# Patient Record
Sex: Female | Born: 1985 | Race: Black or African American | Hispanic: No | Marital: Single | Smoking: Current every day smoker
Health system: Southern US, Community
[De-identification: ages and names within clinical notes are randomized; demographics above are authoritative.]

## PROBLEM LIST (undated history)

## (undated) DIAGNOSIS — E119 Type 2 diabetes mellitus without complications: Secondary | ICD-10-CM

## (undated) DIAGNOSIS — I10 Essential (primary) hypertension: Secondary | ICD-10-CM

## (undated) DIAGNOSIS — E282 Polycystic ovarian syndrome: Secondary | ICD-10-CM

## (undated) DIAGNOSIS — M9908 Segmental and somatic dysfunction of rib cage: Secondary | ICD-10-CM

---
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
## 2005-10-05 ENCOUNTER — Emergency Department: Payer: Self-pay | Admitting: Emergency Medicine

## 2007-02-11 ENCOUNTER — Emergency Department: Payer: Self-pay | Admitting: Emergency Medicine

## 2007-06-17 ENCOUNTER — Emergency Department: Payer: Self-pay | Admitting: Internal Medicine

## 2008-06-26 ENCOUNTER — Emergency Department: Payer: Self-pay | Admitting: Emergency Medicine

## 2010-01-03 ENCOUNTER — Emergency Department: Payer: Self-pay | Admitting: Internal Medicine

## 2011-01-26 ENCOUNTER — Emergency Department: Payer: Self-pay | Admitting: Emergency Medicine

## 2011-02-22 ENCOUNTER — Inpatient Hospital Stay: Payer: Self-pay | Admitting: Internal Medicine

## 2011-05-08 ENCOUNTER — Emergency Department: Payer: Self-pay | Admitting: Emergency Medicine

## 2012-06-15 ENCOUNTER — Emergency Department: Payer: Self-pay | Admitting: Emergency Medicine

## 2012-06-15 LAB — WET PREP, GENITAL

## 2012-06-15 LAB — URINALYSIS, COMPLETE
Leukocyte Esterase: NEGATIVE
Nitrite: NEGATIVE
RBC,UR: 1 /HPF (ref 0–5)
Specific Gravity: 1.017 (ref 1.003–1.030)
Squamous Epithelial: 4
WBC UR: 1 /HPF (ref 0–5)

## 2012-06-15 LAB — PREGNANCY, URINE: Pregnancy Test, Urine: NEGATIVE m[IU]/mL

## 2012-06-17 LAB — URINE CULTURE

## 2013-01-15 ENCOUNTER — Emergency Department: Payer: Self-pay | Admitting: Emergency Medicine

## 2013-05-03 ENCOUNTER — Emergency Department: Payer: Self-pay | Admitting: Emergency Medicine

## 2014-07-12 ENCOUNTER — Emergency Department: Payer: Self-pay | Admitting: Emergency Medicine

## 2014-11-28 ENCOUNTER — Encounter: Payer: Self-pay | Admitting: Urgent Care

## 2014-11-28 ENCOUNTER — Emergency Department
Admission: EM | Admit: 2014-11-28 | Discharge: 2014-11-28 | Disposition: A | Discharge status: Home / Self Care | Payer: Self-pay | Attending: Emergency Medicine | Admitting: Emergency Medicine

## 2014-11-28 ENCOUNTER — Emergency Department: Payer: Self-pay

## 2014-11-28 DIAGNOSIS — Z3202 Encounter for pregnancy test, result negative: Secondary | ICD-10-CM | POA: Insufficient documentation

## 2014-11-28 DIAGNOSIS — E282 Polycystic ovarian syndrome: Secondary | ICD-10-CM | POA: Insufficient documentation

## 2014-11-28 DIAGNOSIS — N939 Abnormal uterine and vaginal bleeding, unspecified: Secondary | ICD-10-CM

## 2014-11-28 DIAGNOSIS — N938 Other specified abnormal uterine and vaginal bleeding: Secondary | ICD-10-CM | POA: Insufficient documentation

## 2014-11-28 DIAGNOSIS — Z720 Tobacco use: Secondary | ICD-10-CM | POA: Insufficient documentation

## 2014-11-28 HISTORY — DX: Polycystic ovarian syndrome: E28.2

## 2014-11-28 LAB — CBC
HCT: 33 % — ABNORMAL LOW (ref 35.0–47.0)
Hemoglobin: 10.1 g/dL — ABNORMAL LOW (ref 12.0–16.0)
MCH: 22.8 pg — AB (ref 26.0–34.0)
MCHC: 30.4 g/dL — ABNORMAL LOW (ref 32.0–36.0)
MCV: 75 fL — AB (ref 80.0–100.0)
Platelets: 309 10*3/uL (ref 150–440)
RBC: 4.4 MIL/uL (ref 3.80–5.20)
RDW: 16.3 % — ABNORMAL HIGH (ref 11.5–14.5)
WBC: 11.2 10*3/uL — ABNORMAL HIGH (ref 3.6–11.0)

## 2014-11-28 LAB — COMPREHENSIVE METABOLIC PANEL
ALBUMIN: 4.1 g/dL (ref 3.5–5.0)
ALT: 15 U/L (ref 14–54)
AST: 18 U/L (ref 15–41)
Alkaline Phosphatase: 87 U/L (ref 38–126)
Anion gap: 7 (ref 5–15)
BILIRUBIN TOTAL: 0.3 mg/dL (ref 0.3–1.2)
BUN: 13 mg/dL (ref 6–20)
CO2: 26 mmol/L (ref 22–32)
CREATININE: 0.87 mg/dL (ref 0.44–1.00)
Calcium: 9.1 mg/dL (ref 8.9–10.3)
Chloride: 106 mmol/L (ref 101–111)
Glucose, Bld: 118 mg/dL — ABNORMAL HIGH (ref 65–99)
Potassium: 3.9 mmol/L (ref 3.5–5.1)
SODIUM: 139 mmol/L (ref 135–145)
Total Protein: 7.7 g/dL (ref 6.5–8.1)

## 2014-11-28 LAB — TYPE AND SCREEN
ABO/RH(D): O POS
ANTIBODY SCREEN: NEGATIVE

## 2014-11-28 LAB — WET PREP, GENITAL
Clue Cells Wet Prep HPF POC: NONE SEEN
Trich, Wet Prep: NONE SEEN
Yeast Wet Prep HPF POC: NONE SEEN

## 2014-11-28 LAB — ABO/RH: ABO/RH(D): O POS

## 2014-11-28 LAB — POCT PREGNANCY, URINE: PREG TEST UR: NEGATIVE

## 2014-11-28 LAB — CHLAMYDIA/NGC RT PCR (ARMC ONLY)
CHLAMYDIA TR: NOT DETECTED
N GONORRHOEAE: NOT DETECTED

## 2014-11-28 LAB — HCG, QUANTITATIVE, PREGNANCY: hCG, Beta Chain, Quant, S: <1 m[IU]/mL (ref <5)

## 2014-11-28 MED ORDER — MEDROXYPROGESTERONE ACETATE 5 MG PO TABS: 5.0000 mg | ORAL_TABLET | Freq: Every day | ORAL | Status: AC

## 2014-11-28 MED ORDER — SODIUM CHLORIDE 0.9 % IV BOLUS (SEPSIS)
1000.0000 mL | Freq: Once | INTRAVENOUS | Status: AC
  Administered 2014-11-28: 1000 mL via INTRAVENOUS

## 2014-11-28 NOTE — ED Notes (Addendum)
Patient presents with reports of dizziness that "just started". Patient reporting that she has been menstruating x 1.5 months. States, "I am scared that it will be like it was in 2012. I passed out and had to get a blood transfusion." Reports using "3 bags" of pads today.

## 2014-11-28 NOTE — ED Notes (Signed)
MD at bedside to review ultrasound results with patient.

## 2014-11-28 NOTE — ED Notes (Signed)
Patient back from Ultrasound.

## 2014-11-28 NOTE — ED Notes (Signed)
Patient transported to Ultrasound 

## 2014-11-28 NOTE — Discharge Instructions (Signed)
Abnormal Uterine Bleeding Abnormal uterine bleeding can affect women at various stages in life, including teenagers, women in their reproductive years, pregnant women, and women who have reached menopause. Several kinds of uterine bleeding are considered abnormal, including:  Bleeding or spotting between periods.   Bleeding after sexual intercourse.   Bleeding that is heavier or more than normal.   Periods that last longer than usual.  Bleeding after menopause.  Many cases of abnormal uterine bleeding are minor and simple to treat, while others are more serious. Any type of abnormal bleeding should be evaluated by your health care provider. Treatment will depend on the cause of the bleeding. HOME CARE INSTRUCTIONS Monitor your condition for any changes. The following actions may help to alleviate any discomfort you are experiencing:  Avoid the use of tampons and douches as directed by your health care provider.  Change your pads frequently. You should get regular pelvic exams and Pap tests. Keep all follow-up appointments for diagnostic tests as directed by your health care provider.  SEEK MEDICAL CARE IF:   Your bleeding lasts more than 1 week.   You feel dizzy at times.  SEEK IMMEDIATE MEDICAL CARE IF:   You pass out.   You are changing pads every 15 to 30 minutes.   You have abdominal pain.  You have a fever.   You become sweaty or weak.   You are passing large blood clots from the vagina.   You start to feel nauseous and vomit. MAKE SURE YOU:   Understand these instructions.  Will watch your condition.  Will get help right away if you are not doing well or get worse. Document Released: 05/05/2005 Document Revised: 05/10/2013 Document Reviewed: 12/02/2012 [employer] Patient Information 2015 [employer]. This information is not intended to replace advice given to you by your health care provider. Make sure you discuss any questions you have with your  health care provider.  

## 2014-11-28 NOTE — ED Provider Notes (Signed)
[facility] Emergency Department Provider Note  ____________________________________________  Time seen: Approximately 407 AM  I have reviewed the triage vital signs and the nursing notes.   HISTORY  Chief Complaint Dizziness    HPI Suzanne Fernandez is a 29 y.o. female who comes in with dizziness. The patient reports that she has been having vaginal bleeding for approximately 1-1/2 months. The patient reports that she has irregular periods will be gone for 3 months and will come back for 1-2 weeks. The patient reports that she has not seen a doctor since this period started. She has had some lower abdominal pain that is dull and sharp. She reports that the dizziness started today and she feels as though she is lightheaded whenever she lays down and gets up. The patient reports that she's had a transfusion in the past in 2012 due to irregular vaginal bleeding.The patient reports that her pain was a 0 out of 10 currently. The patient reports that she has been wearing 2 pads and going through them every hour.   Past Medical History  Diagnosis Date  . PCOS (polycystic ovarian syndrome)     There are no active problems to display for this patient.   History reviewed. No pertinent past surgical history.  No current outpatient prescriptions on file.  Allergies Review of patient's allergies indicates no known allergies.  No family history on file.  Social History History  Substance Use Topics  . Smoking status: Current Every Day Smoker  . Smokeless tobacco: Not on file  . Alcohol Use: No    Review of Systems Constitutional: No fever/chills Eyes: Allergic vision. ENT: No sore throat. Cardiovascular: Denies chest pain. Respiratory: Denies shortness of breath. Gastrointestinal: Lower abdominal pain Genitourinary: Vaginal bleeding Musculoskeletal: Negative for back pain. Skin: Negative for rash. Neurological: Dizziness  10-point ROS otherwise  negative.  ____________________________________________   PHYSICAL EXAM:  VITAL SIGNS: ED Triage Vitals  Enc Vitals Group     BP 11/28/14 0348 161/121 mmHg     Pulse Rate 11/28/14 0348 74     Resp 11/28/14 0348 18     Temp 11/28/14 0348 98.6 F (37 C)     Temp Source 11/28/14 0348 Oral     SpO2 11/28/14 0348 100 %     Weight 11/28/14 0348 207 lb (93.895 kg)     Height 11/28/14 0348  (1.626 m)     Head Cir --      Peak Flow --      Pain Score 11/28/14 0349 0     Pain Loc --      Pain Edu? --      Excl. in GC? --     Constitutional: Alert and oriented. Well appearing and in mild distress. Eyes: Conjunctivae are normal. PERRL. EOMI. Head: Atraumatic. Nose: No congestion/rhinnorhea. Mouth/Throat: Mucous membranes are moist.  Oropharynx non-erythematous. Cardiovascular: Normal rate, regular rhythm. Grossly normal heart sounds.  Good peripheral circulation. Respiratory: Normal respiratory effort.  No retractions. Lungs CTAB. Gastrointestinal: Soft and nontender. No distention. Positive bowel sounds Genitourinary: Normal external genitalia moderate blood in the vaginal vault with a clot removed. No cervical motion tenderness to palpation no masses palpated in the uterus or adnexa no pain to palpation. Musculoskeletal: No lower extremity tenderness nor edema.   Neurologic:  Normal speech and language.   Skin:  Skin is warm, dry and intact. No rash noted. Psychiatric: Mood and affect are normal.   ____________________________________________   LABS (all labs ordered are  listed, but only abnormal results are displayed)  Labs Reviewed  WET PREP, GENITAL - Abnormal; Notable for the following:    WBC, Wet Prep HPF POC FEW (*)    All other components within normal limits  CBC - Abnormal; Notable for the following:    WBC 11.2 (*)    Hemoglobin 10.1 (*)    HCT 33.0 (*)    MCV 75.0 (*)    MCH 22.8 (*)    MCHC 30.4 (*)    RDW 16.3 (*)    All other components within  normal limits  COMPREHENSIVE METABOLIC PANEL - Abnormal; Notable for the following:    Glucose, Bld 118 (*)    All other components within normal limits  CHLAMYDIA/NGC RT PCR ([facility] ONLY)  HCG, QUANTITATIVE, PREGNANCY  POCT PREGNANCY, URINE  TYPE AND SCREEN   ____________________________________________  EKG  None ____________________________________________  RADIOLOGY  Pelvic ultrasound: Acute findings, numerous small bilateral ovarian follicles without evidence of dominant follicle or corpus luteum. Findings meet sonographic criteria for polycystic ovarian syndrome ____________________________________________   PROCEDURES  Procedure(s) performed: None  Critical Care performed: No  ____________________________________________   INITIAL IMPRESSION / ASSESSMENT AND PLAN / ED COURSE  Pertinent labs & imaging results that were available during my care of the patient were reviewed by me and considered in my medical decision making (see chart for details).  This is a [age] year old female whose been having vaginal bleeding for 1-1/2 months who comes in today with dizziness. The patient reports that she is nervous she would need another transfusion. We'll check some blood work as well as a pelvic ultrasound. Currently the patient is not bleeding immensely. We will reassess the patient when she's receive some fluid as well as her ultrasound.  ----------------------------------------- 7:38 AM on 11/28/2014 -----------------------------------------  The patient received a liter of normal saline. Her ultrasound is unremarkable aside from her follicles due to her polycystic ovarian syndrome. I will discharge the patient home with a prescription for Provera and have her follow up with OB/GYN for further evaluation of her dysfunctional uterine bleeding. ____________________________________________   FINAL CLINICAL IMPRESSION(S) / ED DIAGNOSES  Final diagnoses:  Vaginal  bleeding      Rebecka ApleyAllison P Purvis Sidle, MD 11/28/14 [phone]

## 2015-03-27 ENCOUNTER — Emergency Department
Admission: EM | Admit: 2015-03-27 | Discharge: 2015-03-27 | Disposition: A | Discharge status: Home / Self Care | Payer: Self-pay | Attending: Emergency Medicine | Admitting: Emergency Medicine

## 2015-03-27 ENCOUNTER — Emergency Department: Payer: Self-pay

## 2015-03-27 ENCOUNTER — Encounter: Payer: Self-pay | Admitting: Emergency Medicine

## 2015-03-27 DIAGNOSIS — M2141 Flat foot [pes planus] (acquired), right foot: Secondary | ICD-10-CM | POA: Insufficient documentation

## 2015-03-27 DIAGNOSIS — Z720 Tobacco use: Secondary | ICD-10-CM | POA: Insufficient documentation

## 2015-03-27 DIAGNOSIS — R030 Elevated blood-pressure reading, without diagnosis of hypertension: Secondary | ICD-10-CM | POA: Insufficient documentation

## 2015-03-27 DIAGNOSIS — M2142 Flat foot [pes planus] (acquired), left foot: Secondary | ICD-10-CM | POA: Insufficient documentation

## 2015-03-27 DIAGNOSIS — Z79899 Other long term (current) drug therapy: Secondary | ICD-10-CM | POA: Insufficient documentation

## 2015-03-27 MED ORDER — NAPROXEN 500 MG PO TABS: 500.0000 mg | ORAL_TABLET | Freq: Two times a day (BID) | ORAL | Status: DC

## 2015-03-27 MED ORDER — TRAMADOL HCL 50 MG PO TABS: 50.0000 mg | ORAL_TABLET | Freq: Four times a day (QID) | ORAL | Status: DC | PRN

## 2015-03-27 MED ORDER — IBUPROFEN 800 MG PO TABS
800.0000 mg | ORAL_TABLET | Freq: Once | ORAL | Status: AC
  Administered 2015-03-27: 800 mg via ORAL
  Filled 2015-03-27: qty 1

## 2015-03-27 MED ORDER — TRAMADOL HCL 50 MG PO TABS
50.0000 mg | ORAL_TABLET | Freq: Once | ORAL | Status: AC
  Administered 2015-03-27: 50 mg via ORAL
  Filled 2015-03-27: qty 1

## 2015-03-27 NOTE — ED Provider Notes (Signed)
[facility] Emergency Department Provider Note  ____________________________________________  Time seen: Approximately 11:01 AM  I have reviewed the triage vital signs and the nursing notes.   HISTORY  Chief Complaint Foot Pain    HPI Willa RoughKristian S Chandley is a 29 y.o. female patient complaining of bilateral foot pain right greater than left frontal more than a month. Patient denies any injury. Patient states she recently started working a month ago requiring prolonged standing. Patient stated after prolonged standing she noticed swelling and pain to her feet. Patient is rating her pain as a 10 over 10. Patient denies any palliative measures for this complaint.  Past Medical History  Diagnosis Date  . PCOS (polycystic ovarian syndrome)     There are no active problems to display for this patient.   History reviewed. No pertinent past surgical history.  Current Outpatient Rx  Name  Route  Sig  Dispense  Refill  . medroxyPROGESTERone (PROVERA) 5 MG tablet   Oral   Take 1 tablet (5 mg total) by mouth daily.   10 tablet   0     Allergies Review of patient's allergies indicates no known allergies.  History reviewed. No pertinent family history.  Social History Social History  Substance Use Topics  . Smoking status: Current Every Day Smoker  . Smokeless tobacco: None  . Alcohol Use: No    Review of Systems Constitutional: No fever/chills Eyes: No visual changes. ENT: No sore throat. Cardiovascular: Denies chest pain. Respiratory: Denies shortness of breath. Gastrointestinal: No abdominal pain.  No nausea, no vomiting.  No diarrhea.  No constipation. Genitourinary: Negative for dysuria. Musculoskeletal: Lateral foot pain  Skin: Negative for rash. Neurological: Negative for headaches, focal weakness or numbness. 10-point ROS otherwise negative.  ____________________________________________   PHYSICAL EXAM:  VITAL SIGNS: ED Triage Vitals   Enc Vitals Group     BP 03/27/15 1047 154/93 mmHg     Pulse Rate 03/27/15 1047 80     Resp 03/27/15 1047 18     Temp 03/27/15 1047 98.2 F (36.8 C)     Temp Source 03/27/15 1047 Oral     SpO2 03/27/15 1047 100 %     Weight 03/27/15 1047 200 lb (90.719 kg)     Height 03/27/15 1047 5\' 4"  (1.626 m)     Head Cir --      Peak Flow --      Pain Score 03/27/15 1042 10     Pain Loc --      Pain Edu? --      Excl. in GC? --     Constitutional: Alert and oriented. Moderate distress Eyes: Conjunctivae are normal. PERRL. EOMI. Head: Atraumatic. Nose: No congestion/rhinnorhea. Mouth/Throat: Mucous membranes are moist.  Oropharynx non-erythematous. Neck: No stridor.  No cervical spine tenderness to palpation. Hematological/Lymphatic/Immunilogical: No cervical lymphadenopathy. Cardiovascular: Normal rate, regular rhythm. Grossly normal heart sounds.  Good peripheral circulation. Mildly elevated BP Respiratory: Normal respiratory effort.  No retractions. Lungs CTAB. Gastrointestinal: Soft and nontender. No distention. No abdominal bruits. No CVA tenderness. Musculoskeletal: Bilateral Pes Planus. .Neurologic:  Normal speech and language. No gross focal neurologic deficits are appreciated. No gait instability. Skin:  Skin is warm, dry and intact. No rash noted. Psychiatric: Mood and affect are normal. Speech and behavior are normal.  ____________________________________________   LABS (all labs ordered are listed, but only abnormal results are displayed)  Labs Reviewed - No data to display ____________________________________________  EKG   ____________________________________________  RADIOLOGY no acute  findings. I, Joni Reining, personally viewed and evaluated these images (plain radiographs) as part of my medical decision making.   ____________________________________________   PROCEDURES  Procedure(s) performed: None  Critical Care performed:  No  ____________________________________________   INITIAL IMPRESSION / ASSESSMENT AND PLAN / ED COURSE  Pertinent labs & imaging results that were available during my care of the patient were reviewed by me and considered in my medical decision making (see chart for details).  Bilateral foot pain secondary to pes planus. Discussed x-ray findings with patient. Patient given prescription for naproxen and tramadol. Patient given a work excuse for 2 days. Patient advised to follow-up with the podiatry clinic for definitive. ____________________________________________   FINAL CLINICAL IMPRESSION(S) / ED DIAGNOSES  Final diagnoses:  Pes planus of both feet      TAREVA LESKE, PA-C 03/27/15 1246  Sharman Cheek, MD 03/27/15 1521

## 2015-03-27 NOTE — ED Notes (Signed)
States she has had pain to bottoms for both feet since sept..denies any injury pain is increased after standing Also, she has additional complaints of swelling noted to feet with standing

## 2015-03-27 NOTE — ED Notes (Signed)
Pt to ed with c/o bilat feet pain since September.  Pt states she stands all night at work and this has increased her pain.

## 2015-03-27 NOTE — Discharge Instructions (Signed)
Flat Feet Having flat feet is a common condition. One foot or both might be affected. People of any age can have flat feet. In fact, everyone is born with them. But most of the time, the foot gradually develops an arch. That is the curve on the bottom of the foot that creates a gap between the foot and the ground. An arch usually develops in childhood. Sometimes, though, an arch never develops and the foot stays flat on the bottom. Other times, an arch develops but later collapses (caves in). That is what gives the condition its nickname, "fallen arches." The medical term for flat feet is pes planus. Some people have flat feet their whole life and have no problems. For others, the condition causes pain and needs to be corrected.  CAUSES   A problem with the foot's soft tissue; tendons and ligaments could be loose.  This can cause what is called flexible flat feet. That means the shape of the foot changes with pressure. When standing on the toes, a curved arch can be seen. When standing on the ground, the foot is flat.  Wear and tear. Sometimes arches simply flatten over time.  Damage to the posterior tibial tendon. This is the tendon that goes from the inside of the ankle to the bones in the middle of the foot. It is the main support for the arch. If the tendon is injured, stretched or torn, the arch might flatten.  Tarsal coalition. With this condition, two or more bones in the foot are joined together (fused ) during development in the womb. This limits movement and can lead to a flat foot. SYMPTOMS   The foot is even with the ground from toe to heel. Your caregiver will look closely at the inside of the foot while you are standing.  Pain along the bottom of the foot. Some people describe the pain as tightness.  Swelling on the inside of the foot or ankle.  Changes in the way you walk (gait).  The feet lean inward, starting at the ankle (pronation). DIAGNOSIS  To decide if a child or  adult has flat feet, a healthcare provider will probably:  Do a physical examination. This might include having the person stand on his or her toes and then stand normally. The caregiver will also hold the foot and put pressure on the foot in different directions.  Check the person's shoes. The pattern of wear on the soles can offer clues.  Order images (pictures) of the foot. They can help identify the cause of any pain. They also will show injuries to bones or tendons that could be causing the condition. The images can come from:  X-rays.  Computed tomography (CT) scan. This combines X-ray and a computer.  Magnetic resonance imaging (MRI). This uses magnets, radio waves and a computer to take a picture of the foot. It is the best technique to evaluate tendons, ligaments and muscles. TREATMENT   Flexible flat feet usually are painless. Most of the time, gait is not affected. Most children grow out of the condition. Often no treatment is needed. If there is pain, treatment options include:  Orthotics. These are inserts that go in the shoes. They add support and shape to the feet. An orthotic is custom-made from a mold of the foot.  Shoes. Not all shoes are the same. People with flat feet need arch support. However, too much can be painful. It is important to find shoes that offer the right amount   of support. Athletes, especially runners, may need to try shoes made just for people with flatter feet.  Medication. For pain, only take over-the-counter medicine for pain, discomfort, as directed by your caregiver.  Rest. If the feet start to hurt, cut back on the exercise which increases the pain. Use common sense.  For damage to the posterior tibial tendon, options include:  Orthotics. Also adding a wedge on the inside edge may help. This can relieve pressure on the tendon.  Ankle brace, boot or cast. These supports can ease the load on the tendon while it heals.  Surgery. If the tendon is  torn, it might need to be repaired.  For tarsal coalition, similar options apply:  Pain medication.  Orthotics.  A cast and crutches. This keeps weight off the foot.  Physical therapy.  Surgery to remove the bone bridge joining the two bones together. PROGNOSIS  In most people, flat feet do not cause pain or problems. People can go about their normal activities. However, if flat feet are painful, they can and should be treated. Treatment usually relieves the pain. HOME CARE INSTRUCTIONS   Take any medications prescribed by the healthcare provider. Follow the directions carefully.  Wear, or make sure a child wears, orthotics or special shoes if this was suggested. Be sure to ask how often and for how long they should be worn.  Do any exercises or therapy treatments that were suggested.  Take notes on when the pain occurs. This will help healthcare providers decide how to treat the condition.  If surgery is needed, be sure to find out if there is anything that should or should not be done before the operation. SEEK MEDICAL CARE IF:   Pain worsens in the foot or lower leg.  Pain disappears after treatment, but then returns.  Walking or simple exercise becomes difficult or causes foot pain.  Orthotics or special shoes are uncomfortable or painful.   This information is not intended to replace advice given to you by your health care provider. Make sure you discuss any questions you have with your health care provider.   Document Released: 03/02/2009 Document Revised: 07/28/2011 Document Reviewed: 11/01/2014 [employer] Interactive Patient Education 2016 [employer]  

## 2015-12-22 ENCOUNTER — Encounter: Payer: Self-pay | Admitting: Emergency Medicine

## 2015-12-22 ENCOUNTER — Emergency Department
Admission: EM | Admit: 2015-12-22 | Discharge: 2015-12-22 | Disposition: A | Discharge status: Home / Self Care | Payer: Self-pay | Attending: Emergency Medicine | Admitting: Emergency Medicine

## 2015-12-22 ENCOUNTER — Emergency Department: Payer: Self-pay

## 2015-12-22 DIAGNOSIS — Z791 Long term (current) use of non-steroidal anti-inflammatories (NSAID): Secondary | ICD-10-CM | POA: Insufficient documentation

## 2015-12-22 DIAGNOSIS — M79601 Pain in right arm: Secondary | ICD-10-CM | POA: Insufficient documentation

## 2015-12-22 DIAGNOSIS — M79603 Pain in arm, unspecified: Secondary | ICD-10-CM

## 2015-12-22 DIAGNOSIS — Z79899 Other long term (current) drug therapy: Secondary | ICD-10-CM | POA: Insufficient documentation

## 2015-12-22 DIAGNOSIS — F17200 Nicotine dependence, unspecified, uncomplicated: Secondary | ICD-10-CM | POA: Insufficient documentation

## 2015-12-22 LAB — CBC
HEMATOCRIT: 31 % — AB (ref 35.0–47.0)
Hemoglobin: 9.7 g/dL — ABNORMAL LOW (ref 12.0–16.0)
MCH: 19.3 pg — AB (ref 26.0–34.0)
MCHC: 31.4 g/dL — AB (ref 32.0–36.0)
MCV: 61.5 fL — ABNORMAL LOW (ref 80.0–100.0)
Platelets: 354 10*3/uL (ref 150–440)
RBC: 5.04 MIL/uL (ref 3.80–5.20)
RDW: 21.1 % — AB (ref 11.5–14.5)
WBC: 8.4 10*3/uL (ref 3.6–11.0)

## 2015-12-22 LAB — BASIC METABOLIC PANEL
Anion gap: 7 (ref 5–15)
BUN: 15 mg/dL (ref 6–20)
CHLORIDE: 104 mmol/L (ref 101–111)
CO2: 27 mmol/L (ref 22–32)
CREATININE: 0.88 mg/dL (ref 0.44–1.00)
Calcium: 9.4 mg/dL (ref 8.9–10.3)
GFR calc Af Amer: >60 mL/min (ref >60)
GFR calc non Af Amer: >60 mL/min (ref >60)
GLUCOSE: 105 mg/dL — AB (ref 65–99)
Potassium: 3.7 mmol/L (ref 3.5–5.1)
Sodium: 138 mmol/L (ref 135–145)

## 2015-12-22 LAB — CK: Total CK: 116 U/L (ref 38–234)

## 2015-12-22 LAB — MAGNESIUM: Magnesium: 2.2 mg/dL (ref 1.7–2.4)

## 2015-12-22 MED ORDER — KETOROLAC TROMETHAMINE 60 MG/2ML IM SOLN
60.0000 mg | Freq: Once | INTRAMUSCULAR | Status: AC
  Administered 2015-12-22: 60 mg via INTRAMUSCULAR
  Filled 2015-12-22: qty 2

## 2015-12-22 MED ORDER — MELOXICAM 15 MG PO TABS: 15.0000 mg | ORAL_TABLET | Freq: Every day | ORAL | 0 refills | Status: DC

## 2015-12-22 NOTE — ED Notes (Signed)
Discussed pain management with pt, pt states has no driver. Pt states took tylenol pta. Pt wishes to wait for md evaluation for pain management.

## 2015-12-22 NOTE — ED Notes (Signed)
Pt alert and oriented X4, active, cooperative, pt in NAD. RR even and unlabored, color WNL.  Pt informed to return if any life threatening symptoms occur.   

## 2015-12-22 NOTE — ED Triage Notes (Signed)
Pt complains or right arm pain from shoulder to fingers. Pt states she has tingling to right fingers, denies nausea, vomiting, chest pain, dizziness, shob, diaphoresis.

## 2015-12-22 NOTE — ED Provider Notes (Signed)
[facility] Emergency Department Provider Note   ____________________________________________   First MD Initiated Contact with Patient 12/22/15 [phone]     (approximate)  I have reviewed the triage vital signs and the nursing notes.   HISTORY  Chief Complaint Arm Pain    HPI Suzanne Fernandez is a 30 y.o. female who comes into the hospital today with pain in her right arm. The patient reports that the pain started the beginning of the week and starts from her shoulder and goes down her hand. The patient reports she's never had this pain before. She isn't taking Tylenol for the pain. She reports that the pain is achy. The patient's pain is 8 out of 10 in intensity. She does not have a doctor. She denies any injury to her arm. She has been out of work so she's not been using it extraocular doing anything with it. The patient denies any headache, blurred vision, chest pain, shortness of breath, nausea, vomiting, abdominal pain. She reports that she feels as though the pain is in her bones. The patient decided to come into the hospital today for further evaluation.The patient has no weakness, numbness, tingling to her arm.   Past Medical History:  Diagnosis Date  . PCOS (polycystic ovarian syndrome)     There are no active problems to display for this patient.   History reviewed. No pertinent surgical history.  Prior to Admission medications   Medication Sig Start Date End Date Taking? Authorizing Provider  medroxyPROGESTERone (PROVERA) 5 MG tablet Take 1 tablet (5 mg total) by mouth daily. 11/28/14 11/28/15  Rebecka Apley, MD  meloxicam (MOBIC) 15 MG tablet Take 1 tablet (15 mg total) by mouth daily. 12/22/15 12/21/16  Rebecka Apley, MD  naproxen (NAPROSYN) 500 MG tablet Take 1 tablet (500 mg total) by mouth 2 (two) times daily with a meal. 03/27/15   Joni Reining, PA-C  traMADol (ULTRAM) 50 MG tablet Take 1 tablet (50 mg total) by mouth every 6 (six)  hours as needed for moderate pain. 03/27/15   Joni Reining, PA-C    Allergies Review of patient's allergies indicates no known allergies.  No family history on file.  Social History Social History  Substance Use Topics  . Smoking status: Current Every Day Smoker  . Smokeless tobacco: Never Used  . Alcohol use No    Review of Systems Constitutional: No fever/chills Eyes: No visual changes. ENT: No sore throat. Cardiovascular: Denies chest pain. Respiratory: Denies shortness of breath. Gastrointestinal: No abdominal pain.  No nausea, no vomiting.  No diarrhea.  No constipation. Genitourinary: Negative for dysuria. Musculoskeletal: Right arm pain Skin: Negative for rash. Neurological: Negative for headaches, focal weakness or numbness.  10-point ROS otherwise negative.  ____________________________________________   PHYSICAL EXAM:  VITAL SIGNS: ED Triage Vitals  Enc Vitals Group     BP 12/22/15 0010 (!) 177/94     Pulse Rate 12/22/15 0010 92     Resp 12/22/15 0010 16     Temp 12/22/15 0010 98.8 F (37.1 C)     Temp Source 12/22/15 0010 Oral     SpO2 12/22/15 0010 100 %     Weight 12/22/15 0010 210 lb (95.3 kg)     Height 12/22/15 0010 5\' 4"  (1.626 m)     Head Circumference --      Peak Flow --      Pain Score 12/22/15 0011 7     Pain Loc --  Pain Edu? --      Excl. in GC? --     Constitutional: Alert and oriented. Well appearing and in mild distress. Eyes: Conjunctivae are normal. PERRL. EOMI. Head: Atraumatic. Nose: No congestion/rhinnorhea. Mouth/Throat: Mucous membranes are moist.  Oropharynx non-erythematous. Cardiovascular: Normal rate, regular rhythm. Grossly normal heart sounds.  Good peripheral circulation. Respiratory: Normal respiratory effort.  No retractions. Lungs CTAB. Gastrointestinal: Soft and nontender. No distention. Positive bowel sounds Musculoskeletal: Right arm tenderness to palpation Neurologic:  Normal speech and language. No  weakness or numbness to the patient's right arm Skin:  Skin is warm, dry and intact.  Psychiatric: Mood and affect are normal.   ____________________________________________   LABS (all labs ordered are listed, but only abnormal results are displayed)  Labs Reviewed  CBC - Abnormal; Notable for the following:       Result Value   Hemoglobin 9.7 (*)    HCT 31.0 (*)    MCV 61.5 (*)    MCH 19.3 (*)    MCHC 31.4 (*)    RDW 21.1 (*)    All other components within normal limits  BASIC METABOLIC PANEL - Abnormal; Notable for the following:    Glucose, Bld 105 (*)    All other components within normal limits  MAGNESIUM  CK   ____________________________________________  EKG  none ____________________________________________  RADIOLOGY  Doppler ultrasound right upper extremity: No evidence of right upper extremity deep venous thrombosis ____________________________________________   PROCEDURES  Procedure(s) performed: None  Procedures  Critical Care performed: No  ____________________________________________   INITIAL IMPRESSION / ASSESSMENT AND PLAN / ED COURSE  Pertinent labs & imaging results that were available during my care of the patient were reviewed by me and considered in my medical decision making (see chart for details).  This is a 30 year old female who comes in with some right upper extremity pain. The patient's ultrasound does not show a DVT. I'll give the patient a shot of Toradol as well as check some blood work to include a CK and a magnesium. The patient will be reassessed after she is receive her blood work.  Clinical Course    The patient's blood work is unremarkable. The patient's pain is improved after the shot of Toradol. The patient will be discharged home to follow-up with the acute care clinic. I will give the patient prescription for meloxicam which will help with her pain as well. She should have her blood pressure further evaluated at  the walk-in clinic as well. ____________________________________________   FINAL CLINICAL IMPRESSION(S) / ED DIAGNOSES  Final diagnoses:  Arm pain  Pain of right upper extremity      NEW MEDICATIONS STARTED DURING THIS VISIT:  New Prescriptions   MELOXICAM (MOBIC) 15 MG TABLET    Take 1 tablet (15 mg total) by mouth daily.     Note:  This document was prepared using Dragon voice recognition software and may include unintentional dictation errors.    Rebecka Apley, MD 12/22/15 [phone]

## 2016-02-04 ENCOUNTER — Emergency Department
Admission: EM | Admit: 2016-02-04 | Discharge: 2016-02-04 | Disposition: A | Discharge status: Home / Self Care | Payer: Self-pay | Attending: Emergency Medicine | Admitting: Emergency Medicine

## 2016-02-04 ENCOUNTER — Emergency Department: Payer: Self-pay

## 2016-02-04 ENCOUNTER — Encounter: Payer: Self-pay | Admitting: Medical Oncology

## 2016-02-04 DIAGNOSIS — M25572 Pain in left ankle and joints of left foot: Secondary | ICD-10-CM

## 2016-02-04 DIAGNOSIS — F17200 Nicotine dependence, unspecified, uncomplicated: Secondary | ICD-10-CM | POA: Insufficient documentation

## 2016-02-04 DIAGNOSIS — M79672 Pain in left foot: Secondary | ICD-10-CM

## 2016-02-04 DIAGNOSIS — M7989 Other specified soft tissue disorders: Secondary | ICD-10-CM | POA: Insufficient documentation

## 2016-02-04 DIAGNOSIS — R52 Pain, unspecified: Secondary | ICD-10-CM

## 2016-02-04 MED ORDER — GABAPENTIN 100 MG PO CAPS: 100.0000 mg | ORAL_CAPSULE | Freq: Three times a day (TID) | ORAL | 0 refills | Status: AC

## 2016-02-04 NOTE — Discharge Instructions (Signed)
Take chlorpheniramine Delsym as needed for cough and cold symptoms.

## 2016-02-04 NOTE — ED Notes (Signed)
See triage   States she has had bilateral feet pain  States pain is worse on the left   Pain radiates into lower legs  Denies any injury hx of same of several months also has some swelling d/t prolonged standing

## 2016-02-04 NOTE — ED Provider Notes (Signed)
[facility] Emergency Department Provider Note  ____________________________________________  Time seen: Approximately 11:18 AM  I have reviewed the triage vital signs and the nursing notes.   HISTORY  Chief Complaint Ankle Pain and Foot Pain    HPI Suzanne Fernandez is a 30 y.o. female presents for evaluation of recurrent foot pain bilaterally left now greater than right. Patient reports that she's been seen here several times and follow up with podiatry with no relief or diagnosis. Continues to complain of of continued foot pain.   Past Medical History:  Diagnosis Date  . PCOS (polycystic ovarian syndrome)     There are no active problems to display for this patient.   History reviewed. No pertinent surgical history.  Prior to Admission medications   Medication Sig Start Date End Date Taking? Authorizing Provider  gabapentin (NEURONTIN) 100 MG capsule Take 1 capsule (100 mg total) by mouth 3 (three) times daily. 02/04/16 02/03/17  Charmayne Sheerharles M Saralyn Willison, PA-C  medroxyPROGESTERone (PROVERA) 5 MG tablet Take 1 tablet (5 mg total) by mouth daily. 11/28/14 11/28/15  Rebecka ApleyAllison P Webster, MD    Allergies Review of patient's allergies indicates no known allergies.  No family history on file.  Social History Social History  Substance Use Topics  . Smoking status: Current Every Day Smoker  . Smokeless tobacco: Never Used  . Alcohol use No    Review of Systems Constitutional: No fever/chills Eyes: No visual changes. ENT: No sore throat. Cardiovascular: Denies chest pain. Respiratory: Denies shortness of breath. Gastrointestinal: No abdominal pain.  No nausea, no vomiting.  No diarrhea.  No constipation. Genitourinary: Negative for dysuria. Musculoskeletal: Positive for bilateral foot pain and left greater than right Skin: Negative for rash. Neurological: Negative for headaches, focal weakness or numbness.  10-point ROS otherwise  negative.  ____________________________________________   PHYSICAL EXAM:  VITAL SIGNS: ED Triage Vitals  Enc Vitals Group     BP 02/04/16 1112 (!) 152/105     Pulse Rate 02/04/16 1112 80     Resp 02/04/16 1112 20     Temp 02/04/16 1112 98.5 F (36.9 C)     Temp Source 02/04/16 1112 Oral     SpO2 02/04/16 1112 99 %     Weight 02/04/16 1055 210 lb (95.3 kg)     Height 02/04/16 1055 5\' 4"  (1.626 m)     Head Circumference --      Peak Flow --      Pain Score 02/04/16 1055 10     Pain Loc --      Pain Edu? --      Excl. in GC? --     Constitutional: Alert and oriented. Well appearing and in no acute distress. Cardiovascular: Normal rate, regular rhythm. Grossly normal heart sounds.  Good peripheral circulation. Respiratory: Normal respiratory effort.  No retractions. Lungs CTAB. Musculoskeletal: No lower extremity tenderness nor edema.  No joint effusions.Tenderness and swelling to the medial aspect of the left foot. Patient appears to be flat-footed. Neurologic:  Normal speech and language. No gross focal neurologic deficits are appreciated. Gait not tested secondary to pain. Skin:  Skin is warm, dry and intact. No rash noted. Psychiatric: Mood and affect are normal. Speech and behavior are normal.  ____________________________________________   LABS (all labs ordered are listed, but only abnormal results are displayed)  Labs Reviewed - No data to display ____________________________________________  EKG   ____________________________________________  RADIOLOGY  No acute osseous findings noted. ____________________________________________   PROCEDURES  Procedure(s)  performed: None  Critical Care performed: No  ____________________________________________   INITIAL IMPRESSION / ASSESSMENT AND PLAN / ED COURSE  Pertinent labs & imaging results that were available during my care of the patient were reviewed by me and considered in my medical decision making  (see chart for details). Review of the [place] CSRS was performed in accordance of the [facility] prior to dispensing any controlled drugs.  Reassurance provided to the patient. Encouraged her follow-up with Podiatry. as needed.  Clinical Course    ____________________________________________   FINAL CLINICAL IMPRESSION(S) / ED DIAGNOSES  Final diagnoses:  Ankle pain, left  Foot pain, left     This chart was dictated using voice recognition software/Dragon. Despite best efforts to proofread, errors can occur which can change the meaning. Any change was purely unintentional.    Evangeline Dakin, PA-C 02/04/16 1340    Minna Antis, MD 02/04/16 1515

## 2016-02-04 NOTE — ED Triage Notes (Signed)
Bilt foot and ankle pain for years. No known injury.

## 2016-02-09 LAB — RAPID STREP TEST: Rapid Strep screen: NEGATIVE

## 2016-02-09 NOTE — Other (Addendum)
[facility]    [place], [place]                         REPORT TYPE:   Urgent Care Provider Report             REPORT STATUS: Signed    ________________________________________________________________________        PATIENT NAME:      Dorothy Santos,Dorothy Santos   PATIENT ID #:   [id number]    BIRTHDATE:           05-01-1986   ACCOUNT #:     [id number]    AGE:           30      REG DATE:       02/09/16      GENDER:           F          LOC:          [facility]   Urgent Care Arrival: 02/09/16  1138h    ________________________________________________________________________        Lysle DingwallINTERESTED PARTIES:       Location:  [facility]               ATTENDING PHYSICIAN: Vicie MuttersSHULA,Lucille Witts APRN, FNP          ADDITIONAL COPIES:  DECHENES,CYNTHIA MD;DECHENES,CYNTHIA MD                                     ________________________________________________________________________    DICTAVicie Mutters:  Maddalyn Lutze APRN, FNP        History of Present Illness    Date of Service:  Feb 09, 2016    Chief Complaint    Sore throat and dizziness    Travelled outside the [place]:  No    Patient displaying symptoms:  No    This is a 30 year old female who presents to [facility] for a sore throat and   dizziness. She says the symptoms started yesterday. She says it is painful to swallow but she   denies throat swelling. She has had a minor nonproductive cough and a little bit of nasal   discharge. She denies ear pain, sinus pain or pressure, eye discharge, difficulty breathing,   shortness of breath, fever, chills, muscle aches, syncope or near-syncope, abdominal pain, nausea,   or vomiting. She says she has a history of repeated strep throat infections and says she had her   tonsils removed earlier this year due to recurring infections. She's also been feeling a lot of   pressure in her head and headache with her symptoms she is causing some dizziness/lightheadedness   but denies sinus pain or pressure. She is currently on her period and  denies any chance of   pregnancy.        Review of Systems    Constitutional:  denies: chills, fever    Ears, Nose, Throat, Mouth:  sore throat, painful swallowing,  denies: ear pain, ear discharge,   throat swelling    Respiratory:  cough,  denies: shortness of breath, sputum    Cardiovascular:  denies: chest pain    Gastrointestinal:  denies: abdominal pain, constipation, decreased fluid intake, diarrhea, nausea,   vomiting    Neurologic:  dizziness    ALLERGIES:      Coded Allergies:  No Known Allergies (Unverified , 02/09/16)    ------------------------------         HOME MEDICATIONS     Active     Reported    Prenatal 1 Plus 1 Tablet (Prenatal Vit/Fe Fumarate/Fa) 1 Tab Tablet 1 Tab PO             Past Medical History    Medical History:  no pertinent history    Surgical History:  cholecystectomy, tonsillectomy    Social History    Smoking Status:  Never Smoker        Physical Exam    VITAL SIGNS         Vital Signs              Date Time  Temp Pulse Resp B/P Pulse Ox O2 Delivery O2 Flow Rate FiO2         02/09/16 11:54 36.7 83 16 121/77 100 Room Air              Nursing Documentation Vitals:  reviewed by me    *    GENERAL: well-developed, well-nourished, in no distress    HEAD: atraumatic, normocephalic, non-tender frontal and maxillary sinuses on percussion    EYES: PERRLA, no eye discharge or injection    EARS: bilateral tympanic membranes pearly gray, non-bulging, normal light reflex    NOSE: nasal turbinates non-swollen, moist without redness or discharge    OROPHARYNX: Oropharynx is mildly erythematous, no exudates. Buccal mucosa moist,  no posterior   pharyngeal cobblestoning; tonsils are surgically absent; uvula is midline and airway is patent    NECK: Anterior cervical chain with 1 swollen lymph node bilaterally, neck supple, full ROM,  no   stridor    LUNGS: clear to auscultation bilaterally, no wheezes, rales or rhonchi; no percussion tenderness or    dullness; no accessory retraction or nasal flaring    HEART: regular rate and rhythm; no carotid bruits; no JVD; no leg edema    ABDOMEN: Soft, nontender, no hepatosplenomegaly    SKIN: Warm and dry    VASCULAR: Capillary refill less than 2 seconds    PSYCH: Alert and oriented x3        Labs/Rads/ECGs    Laboratory Results         Laboratory Tests            Test 02/09/16         12:09         Group A Streptococcus Rapid Negative                 Course    Medical Decision Making    Patient's vital signs are stable and she's afebrile. Rapid strep was negative. Have sent this for   culture to confirm. If positive, will need to initiate penicillin. Vital signs are stable and   patient afebrile. No evidence of  abscess, epiglottitis, or airway distress. Oropharynx is mildly   erythematous. Patient advised to use Tylenol or ibuprofen and salt water gargles for discomfort.   Advised her to return for new or worsening symptoms. Patient verbalized understanding and   agreement. Suspect her dizziness may be related to her headache. Vitals are stable. Patient denies   room spinning sensations.        Discharge Plan    Clinical Impression:       Primary Impression:       Pharyngitis        Additional Instructions:    Rapid strep  was negative today. We will send this for culture and call you if the culture is   positive, as we would then need to start antibiotics.         Try ibuprofen as directed for pain and fever as needed.         Do salt water gargles to help with the pain.        Continue hydration including cold popsicles, cold clear fluids and soft foods.         Return if symptoms worsen.    Referrals:      DECHENES,CYNTHIA MD (PCP)            Vicie Mutters APRN, FNP Feb 09, 2016 12:13        Electronically Signed By:   Bertram Gala, FNP at 02/09/16 1307        Disclaimer: Portions of this document may contain text elements generated through computerized    voice recognition. Undetected computer generated word errors are possible. Please notify the   signing provider or return the document to [facility] Records department if clarification or   correction is needed.

## 2016-02-12 LAB — CULTURE, STREP THROAT

## 2016-06-09 LAB — AMB EXT LIPID PANEL
HDL, External: 73
LDL-C, External: 97
Total Cholesterol, External: 187
Triglycerides, External: 84

## 2016-06-09 LAB — AMB EXT TSH: TSH, EXTERNAL: 1.92

## 2016-06-09 LAB — AMB EXT HCT: Hct, External: 42.7

## 2016-06-09 LAB — AMB EXT HGBA1C: Hemoglobin A1c, External: 4.7

## 2016-09-01 ENCOUNTER — Encounter

## 2016-10-01 ENCOUNTER — Ambulatory Visit: Attending: Family Medicine | Primary: Family

## 2016-10-09 ENCOUNTER — Ambulatory Visit
Admit: 2016-10-09 | Discharge: 2016-10-09 | Payer: PRIVATE HEALTH INSURANCE | Attending: Family Medicine | Primary: Family

## 2016-10-09 DIAGNOSIS — Z6834 Body mass index (BMI) 34.0-34.9, adult: Secondary | ICD-10-CM

## 2016-10-09 NOTE — Progress Notes (Signed)
Progress Note     Chief Complaint   Patient presents with   ??? Obesity     WK 28, AF,        History of Present Illness  Dorothy Santos is a 31 y.o. old female patient [facility] for a follow up appointment for obesity and associated medical problems.  So far, treatment has included medical nutrition therapy and intensive behavioral therapy.    She is [redacted] weeks pregnant at this time.  She has been struggling lately with food aversions.  She finds that she is more hungry in the morning lately.    Has found she does not desire  Eggs in the morning and one yogurt does fill her, She been eating 2 yogurts or a shake in the morning of late.    Insulin resistance: Tried to limit carbohydrates. She reports that she had a normal glucose tolerance test.      Meal Plan:limiting carbohydrates      Review of Systems        See HPI   The review of systems is negative for General, CV, and Resp.           Weight Management Results  Initial weight: 224 lb 3.2 oz (101.7 kg)  Previous weight: 220 lb 14.4 oz (100.2 kg)  Today's weight: 224 lb 11.2 oz (101.9 kg)  Weight change since previous visit in pounds: 3.8  Weight change since initial visit in pounds: 0.5  Initial BMI (in kg/m^2): 34.72  Today's BMI (in kg/m^2): 34.65  BMI Change: -0.07  Initial hips in inches: 50  Initial waist in inches: 43      VITALS  Visit Vitals   ??? BP 112/60 (BP 1 Location: Left arm, BP Patient Position: At rest)   ??? Pulse 73   ??? Resp 18   ??? Ht 5' 7.52" (1.715 m)   ??? Wt 224 lb 11.2 oz (101.9 kg)   ??? BMI 34.65 kg/m2   .vit      PAST MEDICAL, SURGICAL, SOCIAL, AND FAMILY HISTORY   Past Medical History:   Diagnosis Date   ??? Depression     Depression/anxiety: improved   ??? Fracture of right radius and ulna     fx r ulna, radius age 89   ??? Irregular menses     irreg menses - 36 day average cycle, on clomid summer 2017   ??? Neck pain    ??? Overweight    ??? 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 HX CHOLECYSTECTOMY  2013   ??? HX OTHER SURGICAL      arm surgery s/p forearm fx as a small child   ??? HX TONSILLECTOMY  07/27/2015    tonsillectomy 07/27/15 Dr.Charles Curlene LabrumGasser     Social History   Substance Use Topics   ??? Smoking status: Never Smoker   ??? Smokeless tobacco: Not on file   ??? Alcohol use Not on file        ALLERGIES AND ADVERSE DRUG REACTIONS   Review of patient's allergies indicates no known allergies.     CURRENT MEDICATIONS     Current Outpatient Prescriptions:   ???  OTHER, TRUBIOTICS ORAL CAPSULE (PROBIOTIC PRODUCT) Take one pill by mouth daily, Disp: , Rfl:   ???  PNV,CALCIUM 72/IRON,CARB/FOLIC (PRENATAL PLUS PO), PRENATAL PLUS 27-1 MG ORAL  TABLET (PRENATAL VIT-FE FUMARATE-FA) 1 daily, Disp: , Rfl:     STOP-BANG Screening   No flowsheet data found.       PHYSICAL EXAM     General: Awake, alert, well oriented, pleasant.  Heart: Regular rate and rhythm without murmur.  Lungs:  Clear to auscultation bilaterally.  Extremities: without edema.  Psychiatric:  Normal speech, affect and judgment.      ASSESSMENT & PLAN   Diagnoses and all orders for this visit:    1. BMI 34.0-34.9,adult  Unchanged  Patient has not gained any weight so far in this pregnancy.  Goal to to gain no more that 3 more pounds by 24 weeks and 7 additional pounds in her third trimester.  I cautioned her about giving into cravings.  Discussed increasing vegetable and nutritious food in her diet.      2. Insulin resistance  Fasting insulin level was 12.  She dis not tolerate metformen secondary to diarrhea.  Will continue to focus on limiting carbohydrates.  F/U 4 weeks.   3. [redacted] weeks gestation of pregnancy   Patient has been advised that her overall weight gain should not be more that 11 pounds for her entire pregnacy.  She is ontrack for this goal.  F/U in 4 weeks,     Orders  @DIAGNOSISORDERS @

## 2016-10-20 ENCOUNTER — Ambulatory Visit: Attending: Registered" | Primary: Family

## 2016-10-20 ENCOUNTER — Ambulatory Visit: Admit: 2016-10-20 | Discharge: 2016-10-20 | Payer: PRIVATE HEALTH INSURANCE | Attending: Registered" | Primary: Family

## 2016-10-20 DIAGNOSIS — E668 Other obesity: Secondary | ICD-10-CM

## 2016-10-20 NOTE — Progress Notes (Signed)
10/20/16    Chief Complaint   Patient presents with   ??? Weight Management     Week #28, Wt Mgt, Nutrition Visit #4         Dorothy Santos  12-06-1985    History of Present Illness  31 y.o. female returns for nutrition follow-up assessment (visit #4) for obesity (BMI is 34.99) on 10/20/16  with the [facility]. Arrives with her husband. Pt is [redacted] weeks gestation. Continues to follow an all-food plan with occasional use of meal replacement shakes, when she is in danger of skipping a meal. Fills quickly, and therefore tries to eat 5-6 small meals per day, rather than 3 larger meals. Has some cravings for junk food, but is able to avoid giving in to these cravings. Does not keep trigger foods in the house. Struggles with some boredom or stress eating and her main strategy for dealing with this is to avoid keeping trigger foods in the house. Experiencing some hunger in the morning. The yogurt she has for breakfast hasn't been keeping her satisfied throughout the morning. Also, pt sees a counselor on a weekly basis. Has maintained her wt throughout the majority of her pregnancy. Overall goal is to gain no more than 11 lbs during pregnancy. Asking about nutrition tips for breastfeeding.    24-Hour Diet Recall:  Breakfast: yogurt or a shake  Lunch: yogurt or a shake  Supper: protein with vegetables and 1/2-1 cup of starch  Snacks - Chooses snacks from protein snack list including eggs, cheese, nuts, peanut butter, and vegetables with hummus      Food Allergies: NKFA  Food Intolerances: None reported  Supplements: Prenatal MVI, Benefiber  Ex[facility]/Activity Level[facility]      Estimated Energy Requirements: 2300 calories per day [facility]([employer] [facility] predictive equation with activity factor of 1.2 + 300 kcals for third trimester pregnancy).  Estimated Protein Requirements:  80g pro/day (0.8g/kg)  Estimated Fluid Requirement:  100oz/day (30 mL/kg)         Nutrition Diagnosis: Overweight/obesity related to history of excess calorie intake as evidenced by pre-pregnancy BMI of over 30.      ASSESSMENT & PLAN   Diagnoses and all orders for this visit:    1. Other obesity  Body mass index is 34.99 kg/(m^2).Marland Kitchen.  Wt change since initial visit: -1 lbs.  Wt change since last visit: -1 lbs.  Meal Plan: All Food  --Continue current diet (all food with 30g CHO/meal and 15g CHO/snack).  --Reviewed appropriate food choices and portion sizes.   --Reviewed the importance of adequate hydration. Discussed strategies for increasing water intake.   --Reviewed the benefits of regular physical activity. [facility]  --Discussed the multiple benefits of keeping a detailed and accurate food journal.   --Discussed additional options for breakfast. Suggest pt try adding a small amount (1/4 cup) oatmeal and/or fruit to her breakfast yogurt to satisfy her longer in the morning.  --Briefly discussed the benefits of breastfeeding and calorie needs during breastfeeding. Discussed the importance of adequate hydration in breastfeeding.     Personal Goals:   1.) Join water aerobics classes.          Visit duration is 30 minutes.   Gurfateh Mcclain J Ranata Laughery MS, RD, CSOWM, LD

## 2016-11-18 ENCOUNTER — Ambulatory Visit
Admit: 2016-11-18 | Discharge: 2016-11-18 | Payer: PRIVATE HEALTH INSURANCE | Attending: Family Medicine | Primary: Family

## 2016-11-18 DIAGNOSIS — Z6834 Body mass index (BMI) 34.0-34.9, adult: Secondary | ICD-10-CM

## 2016-11-18 NOTE — Progress Notes (Signed)
Progress Note     Chief Complaint   Patient presents with   ??? Obesity     Week 32, AF, Provider Visit        History of Present Illness  Dorothy Santos is a 31 y.o. old female patient presenting to [facility] for a follow up appointment for obesity and associated medical problems.  So far, treatment has included medical nutrition therapy and intensive behavioral therapy.    Has found limiting carbs really helps her to no longer crave them.    Insulin resistance: Tried to limit carbohydrates. She reports that she had a normal glucose tolerance test      Review of Systems        See HPI   The review of systems is negative for General, CV, and Resp.           Weight Management Results  Initial weight: 224 lb 3.2 oz (101.7 kg)  Previous weight: 220 lb 14.4 oz (100.2 kg)  Today's weight: 224 lb 4.8 oz (101.7 kg)  Weight change since previous visit in pounds: 3.4  Weight change since initial visit in pounds: 0.1  Initial BMI (in kg/m^2): 34.72  Today's BMI (in kg/m^2): 34.99  BMI Change: 0.27  Initial hips in inches: 50  Initial waist in inches: 43      VITALS  There were no vitals taken for this visit.Marland Kitchenvit      PAST MEDICAL, SURGICAL, SOCIAL, AND FAMILY HISTORY   Past Medical History:   Diagnosis Date   ??? Depression     Depression/anxiety: improved   ??? Fracture of right radius and ulna     fx r ulna, radius age 89   ??? Irregular menses     irreg menses - 36 day average cycle, on clomid summer 2017   ??? Neck pain    ??? Overweight    ??? 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 HX CHOLECYSTECTOMY  2013   ??? HX OTHER SURGICAL      arm surgery s/p forearm fx as a small child   ??? HX TONSILLECTOMY  07/27/2015    tonsillectomy 07/27/15 Dr.Charles Curlene Labrum     Social History   Substance Use Topics   ??? Smoking status: Never Smoker   ??? Smokeless tobacco: Not on file   ??? Alcohol use Not on file         ALLERGIES AND ADVERSE DRUG REACTIONS   Review of patient's allergies indicates no known allergies.     CURRENT MEDICATIONS     Current Outpatient Prescriptions:   ???  metroNIDAZOLE (METROGEL) 1 % topical gel, apply to FACE twice a day, Disp: , Rfl: 0  ???  OTHER, TRUBIOTICS ORAL CAPSULE (PROBIOTIC PRODUCT) Take one pill by mouth daily, Disp: , Rfl:   ???  PNV,CALCIUM 72/IRON,CARB/FOLIC (PRENATAL PLUS PO), PRENATAL PLUS 27-1 MG ORAL TABLET (PRENATAL VIT-FE FUMARATE-FA) 1 daily, Disp: , Rfl:     STOP-BANG Screening   No flowsheet data found.       PHYSICAL EXAM     General: Awake, alert, well oriented, pleasant.  Heart: Regular rate and rhythm without murmur.  Lungs:  Clear to auscultation bilaterally.  Extremities: without edema.  Psychiatric:  Normal speech, affect and judgment.      ASSESSMENT & PLAN   Diagnoses and all orders for this visit:  1. BMI 34.0-34.9,adult  Unchanged  Patient has not gained any weight so far in this pregnancy.  Goal to to gain no more that 3 more pounds by 24 weeks and 7 additional pounds in her third trimester.  Discussed increasing vegetable and nutritious food in her diet.  2. [redacted] weeks gestation of pregnancy   Patient has been advised that her overall weight gain should not be more that 11 pounds for her entire pregnacy.  She is on track for this goal.  F[place] baby [place]  3. Insulin resistance   Fasting insulin level was 12.  She did not tolerate metformen secondary to diarrhea.  Will continue to focus on limiting carbohydrates.  F/U after baby is born in September.     Orders  @DIAGNOSISORDERS @

## 2016-11-20 ENCOUNTER — Encounter: Attending: Family Medicine | Primary: Family

## 2017-03-13 ENCOUNTER — Emergency Department
Admission: EM | Admit: 2017-03-13 | Discharge: 2017-03-14 | Disposition: A | Discharge status: Home / Self Care | Payer: Self-pay | Attending: Emergency Medicine | Admitting: Emergency Medicine

## 2017-03-13 DIAGNOSIS — F17200 Nicotine dependence, unspecified, uncomplicated: Secondary | ICD-10-CM | POA: Insufficient documentation

## 2017-03-13 DIAGNOSIS — E119 Type 2 diabetes mellitus without complications: Secondary | ICD-10-CM | POA: Insufficient documentation

## 2017-03-13 DIAGNOSIS — I10 Essential (primary) hypertension: Secondary | ICD-10-CM | POA: Insufficient documentation

## 2017-03-13 LAB — CBC
HCT: 29.9 % — ABNORMAL LOW (ref 35.0–47.0)
Hemoglobin: 9 g/dL — ABNORMAL LOW (ref 12.0–16.0)
MCH: 18.8 pg — ABNORMAL LOW (ref 26.0–34.0)
MCHC: 30 g/dL — AB (ref 32.0–36.0)
MCV: 62.6 fL — ABNORMAL LOW (ref 80.0–100.0)
PLATELETS: 348 10*3/uL (ref 150–440)
RBC: 4.78 MIL/uL (ref 3.80–5.20)
RDW: 17.3 % — AB (ref 11.5–14.5)
WBC: 8.4 10*3/uL (ref 3.6–11.0)

## 2017-03-13 LAB — COMPREHENSIVE METABOLIC PANEL
ALBUMIN: 4.2 g/dL (ref 3.5–5.0)
ALK PHOS: 110 U/L (ref 38–126)
ALT: 15 U/L (ref 14–54)
AST: 18 U/L (ref 15–41)
Anion gap: 10 (ref 5–15)
BILIRUBIN TOTAL: 0.4 mg/dL (ref 0.3–1.2)
BUN: 11 mg/dL (ref 6–20)
CALCIUM: 9.5 mg/dL (ref 8.9–10.3)
CO2: 24 mmol/L (ref 22–32)
CREATININE: 0.81 mg/dL (ref 0.44–1.00)
Chloride: 102 mmol/L (ref 101–111)
GFR calc Af Amer: >60 mL/min (ref >60)
GLUCOSE: 426 mg/dL — AB (ref 65–99)
Potassium: 3.6 mmol/L (ref 3.5–5.1)
Sodium: 136 mmol/L (ref 135–145)
TOTAL PROTEIN: 8.6 g/dL — AB (ref 6.5–8.1)

## 2017-03-13 LAB — URINALYSIS, COMPLETE (UACMP) WITH MICROSCOPIC
BACTERIA UA: NONE SEEN
BILIRUBIN URINE: NEGATIVE
HGB URINE DIPSTICK: NEGATIVE
Ketones, ur: NEGATIVE mg/dL
Leukocytes, UA: NEGATIVE
NITRITE: NEGATIVE
PH: 5 (ref 5.0–8.0)
Protein, ur: NEGATIVE mg/dL
SPECIFIC GRAVITY, URINE: 1.037 — AB (ref 1.005–1.030)

## 2017-03-13 LAB — LIPASE, BLOOD: Lipase: 31 U/L (ref 11–51)

## 2017-03-13 LAB — POCT PREGNANCY, URINE: Preg Test, Ur: NEGATIVE

## 2017-03-13 NOTE — ED Triage Notes (Signed)
Patient c/o urinary frequency, lower abdominal pain X 2 weeks.

## 2017-03-13 NOTE — ED Notes (Signed)
Patient reports hx of anemia and blood transfusion due to heavy menstrual bleeding.

## 2017-03-14 MED ORDER — METFORMIN HCL 500 MG PO TABS
500.0000 mg | ORAL_TABLET | Freq: Once | ORAL | Status: AC
  Administered 2017-03-14: 500 mg via ORAL
  Filled 2017-03-14: qty 1

## 2017-03-14 MED ORDER — AMLODIPINE BESYLATE 5 MG PO TABS
2.5000 mg | ORAL_TABLET | Freq: Once | ORAL | Status: AC
  Administered 2017-03-14: 2.5 mg via ORAL
  Filled 2017-03-14: qty 1

## 2017-03-14 MED ORDER — AMLODIPINE BESYLATE 2.5 MG PO TABS: 2.5000 mg | ORAL_TABLET | Freq: Every day | ORAL | 0 refills | Status: AC

## 2017-03-14 MED ORDER — METFORMIN HCL 500 MG PO TABS: 500.0000 mg | ORAL_TABLET | Freq: Every day | ORAL | 0 refills | Status: AC

## 2017-03-14 NOTE — ED Notes (Signed)
Reviewed d/c instructions, follow-up care, prescriptions, diet with patient. Pt verbalized understanding. MD aware of patient's BP and ordered discharge.

## 2017-03-14 NOTE — ED Provider Notes (Signed)
[facility] Emergency Department Provider Note  ____________________________________________   First MD Initiated Contact with Patient 03/13/17 2326     (approximate)  I have reviewed the triage vital signs and the nursing notes.   HISTORY  Chief Complaint Abdominal Pain and Urinary Frequency   HPI Suzanne Fernandez is a 31 y.o. female with a family history of diabetes who is presenting to the emergency department with urinary frequency as well as lower abdominal pressure with urination.  She denies any burning with urination.  Denies any nausea or vomiting.  Says that both her brother and her mother has a history of diabetes.  The patient reports drinking [facility] as well as sweet tea.  Says that she used to exercise but does not anymore.  Patient says that in the past she has been diagnosed with "borderline diabetes."  Says that she also has had a history of hypertension but is not taking any medications for this.     Past Medical History:  Diagnosis Date  . PCOS (polycystic ovarian syndrome)     There are no active problems to display for this patient.   History reviewed. No pertinent surgical history.  Prior to Admission medications   Medication Sig Start Date End Date Taking? Authorizing Provider  gabapentin (NEURONTIN) 100 MG capsule Take 1 capsule (100 mg total) by mouth 3 (three) times daily. 02/04/16 02/03/17  Beers, Charmayne Sheer, PA-C  medroxyPROGESTERone (PROVERA) 5 MG tablet Take 1 tablet (5 mg total) by mouth daily. 11/28/14 11/28/15  Rebecka Apley, MD    Allergies Patient has no known allergies.  No family history on file.  Social History Social History  Substance Use Topics  . Smoking status: Current Every Day Smoker  . Smokeless tobacco: Never Used  . Alcohol use No    Review of Systems  Constitutional: No fever/chills Eyes: No visual changes. ENT: No sore throat. Cardiovascular: Denies chest pain. Respiratory: Denies  shortness of breath. Gastrointestinal: No abdominal pain.  No nausea, no vomiting.  No diarrhea.  No constipation. Genitourinary: as above Musculoskeletal: Negative for back pain. Skin: Negative for rash. Neurological: Negative for headaches, focal weakness or numbness.   ____________________________________________   PHYSICAL EXAM:  VITAL SIGNS: ED Triage Vitals  Enc Vitals Group     BP 03/13/17 2121 (!) 159/103     Pulse Rate 03/13/17 2121 92     Resp 03/13/17 2121 17     Temp 03/13/17 2121 100.1 F (37.8 C)     Temp Source 03/13/17 2121 Oral     SpO2 03/13/17 2121 100 %     Weight 03/13/17 2123 210 lb (95.3 kg)     Height 03/13/17 2123 5\' 4"  (1.626 m)     Head Circumference --      Peak Flow --      Pain Score 03/13/17 2123 6     Pain Loc --      Pain Edu? --      Excl. in GC? --     Constitutional: Alert and oriented. Well appearing and in no acute distress. Eyes: Conjunctivae are normal.  Head: Atraumatic. Nose: No congestion/rhinnorhea. Mouth/Throat: Mucous membranes are moist.  Neck: No stridor.   Cardiovascular: Normal rate, regular rhythm. Grossly normal heart sounds.  Respiratory: Normal respiratory effort.  No retractions.  Gastrointestinal: Soft and nontender. No distention. No CVA tenderness. Musculoskeletal: No lower extremity tenderness nor edema.  No joint effusions. Neurologic:  Normal speech and language. No gross focal  neurologic deficits are appreciated. Skin:  Skin is warm, dry and intact. No rash noted. Psychiatric: Mood and affect are normal. Speech and behavior are normal.  ____________________________________________   LABS (all labs ordered are listed, but only abnormal results are displayed)  Labs Reviewed  COMPREHENSIVE METABOLIC PANEL - Abnormal; Notable for the following:       Result Value   Glucose, Bld 426 (*)    Total Protein 8.6 (*)    All other components within normal limits  CBC - Abnormal; Notable for the following:     Hemoglobin 9.0 (*)    HCT 29.9 (*)    MCV 62.6 (*)    MCH 18.8 (*)    MCHC 30.0 (*)    RDW 17.3 (*)    All other components within normal limits  URINALYSIS, COMPLETE (UACMP) WITH MICROSCOPIC - Abnormal; Notable for the following:    Color, Urine STRAW (*)    APPearance CLEAR (*)    Specific Gravity, Urine 1.037 (*)    Glucose, UA >=500 (*)    Squamous Epithelial / LPF 0-5 (*)    All other components within normal limits  LIPASE, BLOOD  POC URINE PREG, ED  POCT PREGNANCY, URINE   ____________________________________________  EKG   ____________________________________________  RADIOLOGY   ____________________________________________   PROCEDURES  Procedure(s) performed:   Procedures  Critical Care performed:   ____________________________________________   INITIAL IMPRESSION / ASSESSMENT AND PLAN / ED COURSE  Pertinent labs & imaging results that were available during my care of the patient were reviewed by me and considered in my medical decision making (see chart for details).  Differential diagnosis includes, but is not limited to, ovarian cyst, ovarian torsion, acute appendicitis, diverticulitis, urinary tract infection/pyelonephritis, endometriosis, bowel obstruction, colitis, renal colic, gastroenteritis, hernia, pregnancy related pain including ectopic pregnancy, etc.  As part of my medical decision making, I reviewed the following data within the electronic MEDICAL RECORD NUMBER Notes from prior ED visits  Patient to be started on metformin as well as amlodipine.  I discussed the importance of follow-up with primary care.  The patient will attempt to follow-up within the next 7 days.  We also discussed the importance of lifestyle modification including weight loss, dietary changes and exercise.  The patient is understanding of the diagnosis as well as the treatment plan and willing to comply.        ____________________________________________   FINAL  CLINICAL IMPRESSION(S) / ED DIAGNOSES  Diabetes.  Hypertension.    NEW MEDICATIONS STARTED DURING THIS VISIT:  New Prescriptions   No medications on file     Note:  This document was prepared using Dragon voice recognition software and may include unintentional dictation errors.     Myrna BlazerSchaevitz, David Matthew, MD 03/14/17 [place] [id number]

## 2017-03-21 ENCOUNTER — Emergency Department
Admission: EM | Admit: 2017-03-21 | Discharge: 2017-03-21 | Disposition: A | Discharge status: Home / Self Care | Payer: Self-pay | Attending: Emergency Medicine | Admitting: Emergency Medicine

## 2017-03-21 DIAGNOSIS — Z7984 Long term (current) use of oral hypoglycemic drugs: Secondary | ICD-10-CM | POA: Insufficient documentation

## 2017-03-21 DIAGNOSIS — F17200 Nicotine dependence, unspecified, uncomplicated: Secondary | ICD-10-CM | POA: Insufficient documentation

## 2017-03-21 DIAGNOSIS — Z79899 Other long term (current) drug therapy: Secondary | ICD-10-CM | POA: Insufficient documentation

## 2017-03-21 DIAGNOSIS — I10 Essential (primary) hypertension: Secondary | ICD-10-CM | POA: Insufficient documentation

## 2017-03-21 DIAGNOSIS — E1165 Type 2 diabetes mellitus with hyperglycemia: Secondary | ICD-10-CM | POA: Insufficient documentation

## 2017-03-21 DIAGNOSIS — R739 Hyperglycemia, unspecified: Secondary | ICD-10-CM

## 2017-03-21 HISTORY — DX: Type 2 diabetes mellitus without complications: E11.9

## 2017-03-21 HISTORY — DX: Essential (primary) hypertension: I10

## 2017-03-21 LAB — CBC WITH DIFFERENTIAL/PLATELET
BASOS PCT: 1 %
Basophils Absolute: 0.1 10*3/uL (ref 0–0.1)
Eosinophils Absolute: 0.1 10*3/uL (ref 0–0.7)
Eosinophils Relative: 1 %
HCT: 30 % — ABNORMAL LOW (ref 35.0–47.0)
HEMOGLOBIN: 8.8 g/dL — AB (ref 12.0–16.0)
Lymphocytes Relative: 32 %
Lymphs Abs: 2.2 10*3/uL (ref 1.0–3.6)
MCH: 18.2 pg — ABNORMAL LOW (ref 26.0–34.0)
MCHC: 29.2 g/dL — AB (ref 32.0–36.0)
MCV: 62.3 fL — ABNORMAL LOW (ref 80.0–100.0)
Monocytes Absolute: 0.5 10*3/uL (ref 0.2–0.9)
Monocytes Relative: 8 %
NEUTROS ABS: 4.2 10*3/uL (ref 1.4–6.5)
NEUTROS PCT: 58 %
Platelets: 331 10*3/uL (ref 150–440)
RBC: 4.82 MIL/uL (ref 3.80–5.20)
RDW: 17.4 % — AB (ref 11.5–14.5)
WBC: 7.1 10*3/uL (ref 3.6–11.0)

## 2017-03-21 LAB — BASIC METABOLIC PANEL
ANION GAP: 6 (ref 5–15)
BUN: 14 mg/dL (ref 6–20)
CALCIUM: 9.6 mg/dL (ref 8.9–10.3)
CO2: 26 mmol/L (ref 22–32)
Chloride: 101 mmol/L (ref 101–111)
Creatinine, Ser: 0.78 mg/dL (ref 0.44–1.00)
Glucose, Bld: 382 mg/dL — ABNORMAL HIGH (ref 65–99)
Potassium: 4.2 mmol/L (ref 3.5–5.1)
Sodium: 133 mmol/L — ABNORMAL LOW (ref 135–145)

## 2017-03-21 LAB — GLUCOSE, CAPILLARY
GLUCOSE-CAPILLARY: 380 mg/dL — AB (ref 65–99)
GLUCOSE-CAPILLARY: 281 mg/dL — AB (ref 65–99)

## 2017-03-21 MED ORDER — LIVING WELL WITH DIABETES BOOK
Freq: Once | Status: DC
  Filled 2017-03-21: qty 1

## 2017-03-21 MED ORDER — SODIUM CHLORIDE 0.9 % IV BOLUS (SEPSIS): 1000.0000 mL | Freq: Once | INTRAVENOUS | Status: DC

## 2017-03-21 MED ORDER — ONDANSETRON HCL 4 MG PO TABS: 4.0000 mg | ORAL_TABLET | Freq: Three times a day (TID) | ORAL | 0 refills | Status: AC | PRN

## 2017-03-21 MED ORDER — SODIUM CHLORIDE 0.9 % IV BOLUS (SEPSIS)
1000.0000 mL | Freq: Once | INTRAVENOUS | Status: AC
  Administered 2017-03-21: 1000 mL via INTRAVENOUS

## 2017-03-21 NOTE — ED Provider Notes (Signed)
[facility] Emergency Department Provider Note   ____________________________________________   I have reviewed the triage vital signs and the nursing notes.   HISTORY  Chief Complaint Hyperglycemia   History limited by: Not Limited   HPI Suzanne Fernandez is a 31 y.o. female who presents to the emergency department today because of elevated blood sugar.  DURATION:noticed today SEVERITY: in the 400s CONTEXT: patient recently diagnosed with diabetes roughly 1 week ago. States she stopped taking her metformin because of GI side effects.  MODIFYING FACTORS: none identified ASSOCIATED SYMPTOMS: denies any fevers. Has been having diarrhea and nausea.   Per medical record review patient has a history of recent diagnosis of diabetes.   Past Medical History:  Diagnosis Date  . Diabetes mellitus without complication (HCC)   . Hypertension   . PCOS (polycystic ovarian syndrome)     There are no active problems to display for this patient.   History reviewed. No pertinent surgical history.  Prior to Admission medications   Medication Sig Start Date End Date Taking? Authorizing Provider  amLODipine (NORVASC) 2.5 MG tablet Take 1 tablet (2.5 mg total) by mouth daily. 03/14/17 03/14/18  Schaevitz, Myra Rudeavid Matthew, MD  gabapentin (NEURONTIN) 100 MG capsule Take 1 capsule (100 mg total) by mouth 3 (three) times daily. 02/04/16 02/03/17  Beers, Charmayne Sheerharles M, PA-C  medroxyPROGESTERone (PROVERA) 5 MG tablet Take 1 tablet (5 mg total) by mouth daily. 11/28/14 11/28/15  Rebecka ApleyWebster, Allison P, MD  metFORMIN (GLUCOPHAGE) 500 MG tablet Take 1 tablet (500 mg total) by mouth daily with breakfast. 03/14/17 03/14/18  Schaevitz, Myra Rudeavid Matthew, MD    Allergies Patient has no known allergies.  No family history on file.  Social History Social History  Substance Use Topics  . Smoking status: Current Every Day Smoker  . Smokeless tobacco: Never Used  . Alcohol use No    Review  of Systems Constitutional: Positive for weakness. Eyes: No visual changes. ENT: No sore throat. Cardiovascular: Denies chest pain. Respiratory: Denies shortness of breath. Gastrointestinal: Positive for nausea. Positive for diarrhea.  Genitourinary: Negative for dysuria. Musculoskeletal: Negative for back pain. Skin: Negative for rash. Neurological: Negative for headaches, focal weakness or numbness.  ____________________________________________   PHYSICAL EXAM:  VITAL SIGNS: ED Triage Vitals  Enc Vitals Group     BP 03/21/17 1451 (!) 161/100     Pulse Rate 03/21/17 1451 86     Resp 03/21/17 1451 16     Temp 03/21/17 1451 97.8 F (36.6 C)     Temp Source 03/21/17 1451 Oral     SpO2 03/21/17 1451 100 %     Weight 03/21/17 1452 210 lb (95.3 kg)     Height 03/21/17 1452 5\' 4"  (1.626 m)   Constitutional: Alert and oriented. Well appearing and in no distress. Eyes: Conjunctivae are normal.  ENT   Head: Normocephalic and atraumatic.   Nose: No congestion/rhinnorhea.   Mouth/Throat: Mucous membranes are moist.   Neck: No stridor. Cardiovascular: Normal rate, regular rhythm.  No murmurs, rubs, or gallops. Respiratory: Normal respiratory effort without tachypnea nor retractions. Breath sounds are clear and equal bilaterally. No wheezes/rales/rhonchi. Gastrointestinal: Soft and non tender. No rebound. No guarding.  Genitourinary: Deferred Musculoskeletal: Normal range of motion in all extremities.  Neurologic:  Normal speech and language. No gross focal neurologic deficits are appreciated.  Skin:  Skin is warm, dry and intact. No rash noted. Psychiatric: Mood and affect are normal. Speech and behavior are normal. Patient exhibits appropriate insight  and judgment.  ____________________________________________    LABS (pertinent positives/negatives)  BMP na 133, glu 382 CBC hgb 8,8, wbc  7.1  ____________________________________________   EKG  None  ____________________________________________    RADIOLOGY  None  ____________________________________________   PROCEDURES  Procedures  ____________________________________________   INITIAL IMPRESSION / ASSESSMENT AND PLAN / ED COURSE  Pertinent labs & imaging results that were available during my care of the patient were reviewed by me and considered in my medical decision making (see chart for details).  Patients presents for elevated blood sugar. She has not been taking her metformin. Blood sugar was elevated. No anion gap. No concern for dka. Had a long discussion with patient about metformin and expected adjustment period. Additionally discussed some dietary guidelines and recommended smoking cessation. She states she has follow up with PCP next week.   ____________________________________________   FINAL CLINICAL IMPRESSION(S) / ED DIAGNOSES  Final diagnoses:  Hyperglycemia     Note: This dictation was prepared with Dragon dictation. Any transcriptional errors that result from this process are unintentional     Phineas Semen, MD 03/21/17 [phone]

## 2017-03-21 NOTE — Discharge Instructions (Signed)
Please seek medical attention for any high fevers, chest pain, shortness of breath, change in behavior, persistent vomiting, bloody stool or any other new or concerning symptoms.  

## 2017-03-21 NOTE — ED Triage Notes (Signed)
Pt reports diagnosed with diabetes last week. Started metformin but stopped due to side effects. Came in due to high blood sugar, 380 cbg in triage.

## 2017-03-21 NOTE — ED Notes (Signed)
Pt reports stopped taking metformin due to side effects. Pt reports checked her sugar at home today and it read high.

## 2017-05-21 ENCOUNTER — Emergency Department: Payer: Self-pay

## 2017-05-21 ENCOUNTER — Encounter: Payer: Self-pay | Admitting: Emergency Medicine

## 2017-05-21 ENCOUNTER — Other Ambulatory Visit: Payer: Self-pay

## 2017-05-21 DIAGNOSIS — R110 Nausea: Secondary | ICD-10-CM | POA: Insufficient documentation

## 2017-05-21 DIAGNOSIS — R079 Chest pain, unspecified: Secondary | ICD-10-CM | POA: Insufficient documentation

## 2017-05-21 DIAGNOSIS — Z5321 Procedure and treatment not carried out due to patient leaving prior to being seen by health care provider: Secondary | ICD-10-CM | POA: Insufficient documentation

## 2017-05-21 LAB — BASIC METABOLIC PANEL
Anion gap: 10 (ref 5–15)
BUN: 10 mg/dL (ref 6–20)
CALCIUM: 9.4 mg/dL (ref 8.9–10.3)
CHLORIDE: 103 mmol/L (ref 101–111)
CO2: 25 mmol/L (ref 22–32)
CREATININE: 0.78 mg/dL (ref 0.44–1.00)
GFR calc Af Amer: >60 mL/min (ref >60)
GFR calc non Af Amer: >60 mL/min (ref >60)
GLUCOSE: 106 mg/dL — AB (ref 65–99)
Potassium: 3.5 mmol/L (ref 3.5–5.1)
Sodium: 138 mmol/L (ref 135–145)

## 2017-05-21 LAB — TROPONIN I

## 2017-05-21 LAB — CBC
HCT: 27.4 % — ABNORMAL LOW (ref 35.0–47.0)
Hemoglobin: 8.2 g/dL — ABNORMAL LOW (ref 12.0–16.0)
MCH: 18.8 pg — AB (ref 26.0–34.0)
MCHC: 29.8 g/dL — AB (ref 32.0–36.0)
MCV: 63.1 fL — ABNORMAL LOW (ref 80.0–100.0)
PLATELETS: 466 10*3/uL — AB (ref 150–440)
RBC: 4.34 MIL/uL (ref 3.80–5.20)
RDW: 19.2 % — ABNORMAL HIGH (ref 11.5–14.5)
WBC: 11.7 10*3/uL — ABNORMAL HIGH (ref 3.6–11.0)

## 2017-05-21 NOTE — ED Triage Notes (Signed)
Pt comes into the ED via POV c/o chest pain that is centrally located with some nausea that is associated with it.  Patient denies any shortness of breath, dizziness, etc.  Patient states that she felt as though she was going to pass out with the initial discomfort.  Pain is described as a chest tightness.  Denies any cardiac history.

## 2017-05-22 ENCOUNTER — Emergency Department
Admission: EM | Admit: 2017-05-22 | Discharge: 2017-05-22 | Disposition: A | Discharge status: Home / Self Care | Payer: Self-pay | Attending: Emergency Medicine | Admitting: Emergency Medicine

## 2017-05-22 ENCOUNTER — Telehealth: Payer: Self-pay | Admitting: Emergency Medicine

## 2017-05-22 NOTE — Telephone Encounter (Signed)
Called patient due to lwot to inquire about condition and follow up plans. Left message.   

## 2017-07-24 ENCOUNTER — Emergency Department: Payer: BLUE CROSS/BLUE SHIELD

## 2017-07-24 ENCOUNTER — Other Ambulatory Visit: Payer: Self-pay

## 2017-07-24 ENCOUNTER — Emergency Department
Admission: EM | Admit: 2017-07-24 | Discharge: 2017-07-24 | Disposition: A | Discharge status: Home / Self Care | Payer: BLUE CROSS/BLUE SHIELD | Attending: Emergency Medicine | Admitting: Emergency Medicine

## 2017-07-24 DIAGNOSIS — Z3A01 Less than 8 weeks gestation of pregnancy: Secondary | ICD-10-CM | POA: Diagnosis not present

## 2017-07-24 DIAGNOSIS — O99331 Smoking (tobacco) complicating pregnancy, first trimester: Secondary | ICD-10-CM | POA: Diagnosis not present

## 2017-07-24 DIAGNOSIS — O24111 Pre-existing diabetes mellitus, type 2, in pregnancy, first trimester: Secondary | ICD-10-CM | POA: Insufficient documentation

## 2017-07-24 DIAGNOSIS — Z794 Long term (current) use of insulin: Secondary | ICD-10-CM | POA: Insufficient documentation

## 2017-07-24 DIAGNOSIS — R102 Pelvic and perineal pain: Secondary | ICD-10-CM

## 2017-07-24 DIAGNOSIS — O10011 Pre-existing essential hypertension complicating pregnancy, first trimester: Secondary | ICD-10-CM | POA: Insufficient documentation

## 2017-07-24 DIAGNOSIS — R109 Unspecified abdominal pain: Secondary | ICD-10-CM

## 2017-07-24 DIAGNOSIS — F17200 Nicotine dependence, unspecified, uncomplicated: Secondary | ICD-10-CM | POA: Diagnosis not present

## 2017-07-24 DIAGNOSIS — O26891 Other specified pregnancy related conditions, first trimester: Secondary | ICD-10-CM | POA: Diagnosis not present

## 2017-07-24 LAB — COMPREHENSIVE METABOLIC PANEL
ALBUMIN: 4 g/dL (ref 3.5–5.0)
ALK PHOS: 66 U/L (ref 38–126)
ALT: 9 U/L — AB (ref 14–54)
AST: 18 U/L (ref 15–41)
Anion gap: 9 (ref 5–15)
BILIRUBIN TOTAL: 0.6 mg/dL (ref 0.3–1.2)
BUN: 7 mg/dL (ref 6–20)
CO2: 21 mmol/L — ABNORMAL LOW (ref 22–32)
CREATININE: 0.68 mg/dL (ref 0.44–1.00)
Calcium: 9.3 mg/dL (ref 8.9–10.3)
Chloride: 105 mmol/L (ref 101–111)
GFR calc Af Amer: >60 mL/min (ref >60)
GLUCOSE: 133 mg/dL — AB (ref 65–99)
Potassium: 4.1 mmol/L (ref 3.5–5.1)
Sodium: 135 mmol/L (ref 135–145)
TOTAL PROTEIN: 7.9 g/dL (ref 6.5–8.1)

## 2017-07-24 LAB — URINALYSIS, COMPLETE (UACMP) WITH MICROSCOPIC
Bacteria, UA: NONE SEEN
Bilirubin Urine: NEGATIVE
Glucose, UA: NEGATIVE mg/dL
Hgb urine dipstick: NEGATIVE
Ketones, ur: NEGATIVE mg/dL
Leukocytes, UA: NEGATIVE
NITRITE: NEGATIVE
PH: 6 (ref 5.0–8.0)
Protein, ur: NEGATIVE mg/dL
SPECIFIC GRAVITY, URINE: 1.008 (ref 1.005–1.030)

## 2017-07-24 LAB — CBC
HCT: 27.6 % — ABNORMAL LOW (ref 35.0–47.0)
Hemoglobin: 8.2 g/dL — ABNORMAL LOW (ref 12.0–16.0)
MCH: 18.5 pg — ABNORMAL LOW (ref 26.0–34.0)
MCHC: 29.8 g/dL — AB (ref 32.0–36.0)
MCV: 62.1 fL — ABNORMAL LOW (ref 80.0–100.0)
PLATELETS: 482 10*3/uL — AB (ref 150–440)
RBC: 4.45 MIL/uL (ref 3.80–5.20)
RDW: 18.6 % — AB (ref 11.5–14.5)
WBC: 8 10*3/uL (ref 3.6–11.0)

## 2017-07-24 LAB — HCG, QUANTITATIVE, PREGNANCY: HCG, BETA CHAIN, QUANT, S: 2040 m[IU]/mL — AB (ref <5)

## 2017-07-24 NOTE — ED Triage Notes (Signed)
Lower pelvic pain that started at approx 3PM, denies vaginal bleeding. Pt approx 4-[redacted] weeks pregnant. Pt alert and oriented X4, active, cooperative, pt in NAD. RR even and unlabored, color WNL.

## 2017-07-24 NOTE — Discharge Instructions (Signed)
First of all CONGRATULATIONS!  Fortunately today your ultrasound and your blood work were very reassuring.  Please use Tylenol as needed for pain and follow-up with your OB gynecologist this coming week as scheduled.  Return to the emergency department sooner for any new or worsening symptoms such as fevers, chills, worsening pain, if you cannot eat or drink, or for any other issues whatsoever.  It was a pleasure to take care of you today, and thank you for coming to our emergency department.  If you have any questions or concerns before leaving please ask the nurse to grab me and I'm more than happy to go through your aftercare instructions again.  If you were prescribed any opioid pain medication today such as Norco, Vicodin, Percocet, morphine, hydrocodone, or oxycodone please make sure you do not drive when you are taking this medication as it can alter your ability to drive safely.  If you have any concerns once you are home that you are not improving or are in fact getting worse before you can make it to your follow-up appointment, please do not hesitate to call 911 and come back for further evaluation.  Merrily BrittleNeil Dary Dilauro, MD  Results for orders placed or performed during the hospital encounter of 07/24/17  Comprehensive metabolic panel  Result Value Ref Range   Sodium 135 135 - 145 mmol/L   Potassium 4.1 3.5 - 5.1 mmol/L   Chloride 105 101 - 111 mmol/L   CO2 21 (L) 22 - 32 mmol/L   Glucose, Bld 133 (H) 65 - 99 mg/dL   BUN 7 6 - 20 mg/dL   Creatinine, Ser 1.610.68 0.44 - 1.00 mg/dL   Calcium 9.3 8.9 - 09.610.3 mg/dL   Total Protein 7.9 6.5 - 8.1 g/dL   Albumin 4.0 3.5 - 5.0 g/dL   AST 18 15 - 41 U/L   ALT 9 (L) 14 - 54 U/L   Alkaline Phosphatase 66 38 - 126 U/L   Total Bilirubin 0.6 0.3 - 1.2 mg/dL   GFR calc non Af Amer >60 >60 mL/min   GFR calc Af Amer >60 >60 mL/min   Anion gap 9 5 - 15  CBC  Result Value Ref Range   WBC 8.0 3.6 - 11.0 K/uL   RBC 4.45 3.80 - 5.20 MIL/uL   Hemoglobin  8.2 (L) 12.0 - 16.0 g/dL   HCT 04.527.6 (L) 40.935.0 - 81.147.0 %   MCV 62.1 (L) 80.0 - 100.0 fL   MCH 18.5 (L) 26.0 - 34.0 pg   MCHC 29.8 (L) 32.0 - 36.0 g/dL   RDW 91.418.6 (H) 78.211.5 - 95.614.5 %   Platelets 482 (H) 150 - 440 K/uL  Urinalysis, Complete w Microscopic  Result Value Ref Range   Color, Urine STRAW (A) YELLOW   APPearance CLEAR (A) CLEAR   Specific Gravity, Urine 1.008 1.005 - 1.030   pH 6.0 5.0 - 8.0   Glucose, UA NEGATIVE NEGATIVE mg/dL   Hgb urine dipstick NEGATIVE NEGATIVE   Bilirubin Urine NEGATIVE NEGATIVE   Ketones, ur NEGATIVE NEGATIVE mg/dL   Protein, ur NEGATIVE NEGATIVE mg/dL   Nitrite NEGATIVE NEGATIVE   Leukocytes, UA NEGATIVE NEGATIVE   RBC / HPF 0-5 0 - 5 RBC/hpf   WBC, UA 0-5 0 - 5 WBC/hpf   Bacteria, UA NONE SEEN NONE SEEN   Squamous Epithelial / LPF 0-5 (A) NONE SEEN   Mucus PRESENT   hCG, quantitative, pregnancy  Result Value Ref Range   hCG, Beta Chain, Quant,  S 2,040 (H) <5 mIU/mL   US Ob Comp < 14 Wks  Result Date: 07/24/2017 CLINICAL DATA:  32 year old pregnant female with pelvic pain. Beta HCG of 2000. EXAM: OBSTETRIC <14 WK [place] AND TRANSVAGINAL OB US TECHNIQUE: Both transabdominal and transvaginal ultrasound examinations were performed for complete evaluation of the gestation as well as the maternal uterus, adnexal regions, and pelvic cul-de-sac. Transvaginal technique was performed to assess early pregnancy. COMPARISON:  None. FINDINGS: Intrauterine gestational sac: Single Yolk sac:  Visualized. Embryo:  Not Visualized. MSD: 5.0 mm   5 w   2  d Subchorionic hemorrhage:  None visualized. Maternal uterus/adnexae: The ovaries bilaterally are unremarkable. A 2.2 cm probable RIGHT posterior uterine fibroid noted. IMPRESSION: 1. Single intrauterine gestational sac containing a yolk sac. No fetal pole identified at this time. Estimated gestational age of [redacted] weeks 2 days by mean sac. 2. No evidence of subchorionic hemorrhage. 3. Probable 2.2 cm RIGHT uterine fibroid.  Electronically Signed   By: Harmon Pier M.D.   On: 07/24/2017 19:23   US Ob Transvaginal  Result Date: 07/24/2017 CLINICAL DATA:  32 year old pregnant female with pelvic pain. Beta HCG of 2000. EXAM: OBSTETRIC <14 WK [place] AND TRANSVAGINAL OB US TECHNIQUE: Both transabdominal and transvaginal ultrasound examinations were performed for complete evaluation of the gestation as well as the maternal uterus, adnexal regions, and pelvic cul-de-sac. Transvaginal technique was performed to assess early pregnancy. COMPARISON:  None. FINDINGS: Intrauterine gestational sac: Single Yolk sac:  Visualized. Embryo:  Not Visualized. MSD: 5.0 mm   5 w   2  d Subchorionic hemorrhage:  None visualized. Maternal uterus/adnexae: The ovaries bilaterally are unremarkable. A 2.2 cm probable RIGHT posterior uterine fibroid noted. IMPRESSION: 1. Single intrauterine gestational sac containing a yolk sac. No fetal pole identified at this time. Estimated gestational age of [redacted] weeks 2 days by mean sac. 2. No evidence of subchorionic hemorrhage. 3. Probable 2.2 cm RIGHT uterine fibroid. Electronically Signed   By: Harmon Pier M.D.   On: 07/24/2017 19:23

## 2017-07-24 NOTE — ED Provider Notes (Signed)
[facility] Emergency Department Provider Note  ____________________________________________   First MD Initiated Contact with Patient 07/24/17 1900     (approximate)  I have reviewed the triage vital signs and the nursing notes.   HISTORY  Chief Complaint Pelvic Pain    HPI Suzanne Fernandez is a 32 y.o. female self presents to the emergency department with sudden onset moderate severity intermittent cramping lower pelvic pain that began several hours prior to arrival.  No vaginal bleeding.  The patient reports being roughly 4-[redacted] weeks pregnant with a desired pregnancy.  She was not taking fertility medications.  She denies fevers or chills.  Her symptoms are currently moderate severity and nonradiating.  Nothing particular seems to make the symptoms better or worse.  Past Medical History:  Diagnosis Date  . Diabetes mellitus without complication (HCC)   . Hypertension   . PCOS (polycystic ovarian syndrome)     There are no active problems to display for this patient.   History reviewed. No pertinent surgical history.  Prior to Admission medications   Medication Sig Start Date End Date Taking? Authorizing Provider  amLODipine (NORVASC) 2.5 MG tablet Take 1 tablet (2.5 mg total) by mouth daily. 03/14/17 03/14/18  Schaevitz, Myra Rude, MD  gabapentin (NEURONTIN) 100 MG capsule Take 1 capsule (100 mg total) by mouth 3 (three) times daily. 02/04/16 02/03/17  Beers, Charmayne Sheer, PA-C  medroxyPROGESTERone (PROVERA) 5 MG tablet Take 1 tablet (5 mg total) by mouth daily. 11/28/14 11/28/15  Rebecka Apley, MD  metFORMIN (GLUCOPHAGE) 500 MG tablet Take 1 tablet (500 mg total) by mouth daily with breakfast. 03/14/17 03/14/18  Myrna Blazer, MD  ondansetron (ZOFRAN) 4 MG tablet Take 1 tablet (4 mg total) by mouth every 8 (eight) hours as needed for nausea or vomiting. 03/21/17   Phineas Semen, MD    Allergies Patient has no known allergies.  No  family history on file.  Social History Social History   Tobacco Use  . Smoking status: Current Every Day Smoker  . Smokeless tobacco: Never Used  Substance Use Topics  . Alcohol use: No  . Drug use: No    Review of Systems Constitutional: No fever/chills Eyes: No visual changes. ENT: No sore throat. Cardiovascular: Denies chest pain. Respiratory: Denies shortness of breath. Gastrointestinal: Positive for abdominal pain.  No nausea, no vomiting.  No diarrhea.  No constipation. Genitourinary: Negative for dysuria. Musculoskeletal: Negative for back pain. Skin: Negative for rash. Neurological: Negative for headaches, focal weakness or numbness.   ____________________________________________   PHYSICAL EXAM:  VITAL SIGNS: ED Triage Vitals [07/24/17 1618]  Enc Vitals Group     BP 137/71     Pulse Rate 74     Resp 18     Temp 98.5 F (36.9 C)     Temp Source Oral     SpO2 94 %     Weight 193 lb (87.5 kg)     Height      Head Circumference      Peak Flow      Pain Score 10     Pain Loc      Pain Edu?      Excl. in GC?     Constitutional: Alert and oriented x4 well-appearing nontoxic no diaphoresis speaks full clear sentences Eyes: PERRL EOMI. Head: Atraumatic. Nose: No congestion/rhinnorhea. Mouth/Throat: No trismus Neck: No stridor.   Cardiovascular: Normal rate, regular rhythm. Grossly normal heart sounds.  Good peripheral circulation. Respiratory: Normal respiratory  effort.  No retractions. Lungs CTAB and moving good air Gastrointestinal: Soft nondistended nontender no rebound no guarding no peritonitis no McBurney's tenderness negative Rovsing's no costovertebral tenderness Musculoskeletal: No lower extremity edema   Neurologic:  Normal speech and language. No gross focal neurologic deficits are appreciated. Skin:  Skin is warm, dry and intact. No rash noted. Psychiatric: Mood and affect are normal. Speech and behavior are  normal.    ____________________________________________   DIFFERENTIAL includes but not limited to  Threatened abortion, inevitable abortion, completed abortion, ectopic pregnancy, appendicitis, diverticulitis ____________________________________________   LABS (all labs ordered are listed, but only abnormal results are displayed)  Labs Reviewed  COMPREHENSIVE METABOLIC PANEL - Abnormal; Notable for the following components:      Result Value   CO2 21 (*)    Glucose, Bld 133 (*)    ALT 9 (*)    All other components within normal limits  CBC - Abnormal; Notable for the following components:   Hemoglobin 8.2 (*)    HCT 27.6 (*)    MCV 62.1 (*)    MCH 18.5 (*)    MCHC 29.8 (*)    RDW 18.6 (*)    Platelets 482 (*)    All other components within normal limits  URINALYSIS, COMPLETE (UACMP) WITH MICROSCOPIC - Abnormal; Notable for the following components:   Color, Urine STRAW (*)    APPearance CLEAR (*)    Squamous Epithelial / LPF 0-5 (*)    All other components within normal limits  HCG, QUANTITATIVE, PREGNANCY - Abnormal; Notable for the following components:   hCG, Beta Chain, Quant, S 2,040 (*)    All other components within normal limits    Lab work reviewed by me shows chronic anemia at the patient's baseline __________________________________________  EKG   ____________________________________________  RADIOLOGY  First trimester ultrasound reviewed by me shows what appears to be an intrauterine pregnancy with no subchorionic hemorrhage ____________________________________________   PROCEDURES  Procedure(s) performed: no  Procedures  Critical Care performed: o  Observation: no ____________________________________________   INITIAL IMPRESSION / ASSESSMENT AND PLAN / ED COURSE  Pertinent labs & imaging results that were available during my care of the patient were reviewed by me and considered in my medical decision making (see chart for  details).  The patient is hemodynamically stable and her pain is currently well controlled.  Her abdominal exam is benign.  She is Rh+.  She has no bleeding.  Her ultrasound shows intrauterine pregnancy.  She is not taking fertility medications.  Had a lengthy discussion with patient regarding the diagnostic uncertainty of her discomfort and that it is still possible to have appendicitis and pregnancy.  At this point I do not believe she warrants further advanced imaging.  Strict return precautions have been given and the patient verbalizes understanding and agreement with the plan.      ____________________________________________   FINAL CLINICAL IMPRESSION(S) / ED DIAGNOSES  Final diagnoses:  Pelvic pain in female  Abdominal pain during pregnancy in first trimester      NEW MEDICATIONS STARTED DURING THIS VISIT:  Discharge Medication List as of 07/24/2017  7:39 PM       Note:  This document was prepared using Dragon voice recognition software and may include unintentional dictation errors.     Merrily Brittleifenbark, Kaylynne Andres, MD 07/26/17 1326

## 2017-07-24 NOTE — ED Notes (Signed)
FN: pt to ed via ems pt 4/6 weeks preg, with abd pain, no bleeding or spotting, pain as pressure. Pt with hx DM, HTN.

## 2017-09-19 ENCOUNTER — Inpatient Hospital Stay
Admit: 2017-09-19 | Discharge: 2017-09-19 | Disposition: A | Discharge status: Home / Self Care | Payer: PRIVATE HEALTH INSURANCE | Attending: Nurse Practitioner

## 2017-09-19 DIAGNOSIS — J020 Streptococcal pharyngitis: Secondary | ICD-10-CM

## 2017-09-19 LAB — POC GROUP A STREP
Group A strep (POC): POSITIVE
Lot no.: 8181534

## 2017-09-19 MED ORDER — AMOXICILLIN 500 MG TABLET
500 mg | ORAL_TABLET | Freq: Two times a day (BID) | ORAL | 0 refills | Status: AC
Start: 2017-09-19 — End: 2017-09-29

## 2017-09-19 NOTE — Other (Signed)
HPI   This is a 32 year old female who presents to urgent care with complaints of soreness of throat fever trouble with swallowing.   Her symptoms have been ongoing for a couple of days.   She has used over-the-counter antipyretics with relief.   She goes on to state that approximately 2 weeks ago she had a sore throat that went away on its own.   She also states that she did have a tonsillectomy when she was a child.   She is able to manage her own secretions denies nausea vomiting diarrhea  Past Medical History:   Diagnosis Date   ??? Depression     Depression/anxiety: improved   ??? Fracture of right radius and ulna     fx r ulna, radius age 61   ??? Irregular menses     irreg menses - 36 day average cycle, on clomid summer 2017   ??? Neck pain    ??? Overweight    ??? 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 HX CHOLECYSTECTOMY  2013   ??? HX OTHER SURGICAL      arm surgery s/p forearm fx as a small child   ??? HX TONSILLECTOMY  07/27/2015    tonsillectomy 07/27/15 Dr.Charles Gasser         Family History   Problem Relation Age of Onset   ??? Attention Deficit Disorder Mother    ??? No Known Problems Father    ??? No Known Problems Sister    ??? Attention Deficit Disorder Brother         Brother- ADHD   ??? No Known Problems Maternal Grandmother    ??? Other Paternal Grandmother         PGM healthy, hip replacement   ??? Hypertension Paternal Grandfather         Social History     Socioeconomic History   ??? Marital status: MARRIED     Spouse name: Not on file   ??? Number of children: Not on file   ??? Years of education: Not on file   ??? Highest education level: Not on file   Occupational History   ??? Not on file   Social Needs   ??? Financial resource strain: Not on file   ??? Food insecurity:     Worry: Not on file     Inability: Not on file   ??? Transportation needs:     Medical: Not on file     Non-medical: Not on file   Tobacco Use    ??? Smoking status: Never Smoker   ??? Smokeless tobacco: Never Used   Substance and Sexual Activity   ??? Alcohol use: Not on file   ??? Drug use: Not on file   ??? Sexual activity: Not on file   Lifestyle   ??? Physical activity:     Days per week: Not on file     Minutes per session: Not on file   ??? Stress: Not on file   Relationships   ??? Social connections:     Talks on phone: Not on file     Gets together: Not on file     Attends religious service: Not on file     Active member of club or organization: Not on file     Attends meetings of clubs or organizations: Not on file  Relationship status: Not on file   ??? Intimate partner violence:     Fear of current or ex partner: Not on file     Emotionally abused: Not on file     Physically abused: Not on file     Forced sexual activity: Not on file   Other Topics Concern   ??? Not on file   Social History Narrative    Marital Status Married    Children:no children, she has had fertility treatment in the past     Occupation:self employed,message therapy    pregnant                ALLERGIES: Patient has no known allergies.    Review of Systems   Constitutional: Positive for fever.   HENT: Positive for sore throat and trouble swallowing.        Vitals:    09/19/17 0912   BP: 123/75   Pulse: 93   Resp: 18   Temp: 98.4 ??F (36.9 ??C)   SpO2: 98%       Physical Exam    GENERAL: well-developed, well-nourished, in no distress  HEAD: atraumatic, normocephalic, non-tender frontal and maxillary sinuses on percussion  EYES: PERRLA, no eye discharge or injection  EARS: bilateral tympanic membranes pearly gray, non-bulging, normal light reflex  NOSE: nasal turbinates non-swollen, moist without redness or discharge  OROPHARYNX: oropharynx with erythema. Tonsils absent  NECK: supple, no adenopathy, full ROM, non-tender, no stridor  LUNGS: clear to auscultation bilaterally, no wheezes, rales or rhonchi; no percussion tenderness or dullness; no accessory retraction or nasal flaring   HEART: regular rate and rhythm; no carotid bruits; no JVD; no leg edema  ABDOMEN: Soft, nontender, no hepatosplenomegaly          MDM    Rapid strep positive. Negative abscess appreciated on examination. Patient is afebrile and nontoxic appearing, without trismus or mastoid tenderness. Airway is not compromised. Patient prescribed antibiotics.               Procedures

## 2017-09-19 NOTE — Other (Signed)
Patient has sore throat since Wednesday. Patient has cough, nasal congestion and headache since yesterday.

## 2017-10-05 ENCOUNTER — Inpatient Hospital Stay
Admit: 2017-10-05 | Discharge: 2017-10-05 | Disposition: A | Discharge status: Home / Self Care | Payer: PRIVATE HEALTH INSURANCE | Attending: Physician Assistant

## 2017-10-05 DIAGNOSIS — J209 Acute bronchitis, unspecified: Secondary | ICD-10-CM

## 2017-10-05 MED ORDER — BENZONATATE 200 MG CAP
200 mg | ORAL_CAPSULE | Freq: Three times a day (TID) | ORAL | 0 refills | Status: AC | PRN
Start: 2017-10-05 — End: ?

## 2017-10-05 MED ORDER — PREDNISONE 20 MG TAB
20 mg | ORAL_TABLET | ORAL | 0 refills | Status: AC
Start: 2017-10-05 — End: ?

## 2017-10-05 MED ORDER — IPRATROPIUM-ALBUTEROL 2.5 MG-0.5 MG/3 ML NEB SOLUTION
2.5 mg-0.5 mg/3 ml | RESPIRATORY_TRACT | Status: AC
Start: 2017-10-05 — End: 2017-10-05
  Administered 2017-10-05: 21:00:00 via RESPIRATORY_TRACT

## 2017-10-05 MED FILL — IPRATROPIUM-ALBUTEROL 2.5 MG-0.5 MG/3 ML NEB SOLUTION: 2.5 mg-0.5 mg/3 ml | RESPIRATORY_TRACT | Qty: 3

## 2017-10-05 NOTE — Other (Signed)
HPI     32 year old female presenting with shortness of breath and cough for 3 days.  Patient was diagnosed with strep throat week ago and is currently on antibiotics.  Pt denies fever, chills, sweats, ear discharge, ear pain, sinus congestion, bloody rhinitis, headaches, chest pain, hemoptysis body aches, abdominal pain, nausea, vomiting, diarrhea, or rash. Without change in appetite or weight loss/gain.  Patient without tobacco abuse or asthma history.    Past Medical History:   Diagnosis Date   ??? Depression     Depression/anxiety: improved   ??? Fracture of right radius and ulna     fx r ulna, radius age 22   ??? Irregular menses     irreg menses - 36 day average cycle, on clomid summer 2017   ??? Neck pain    ??? Overweight    ??? 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 HX CHOLECYSTECTOMY  2013   ??? HX OTHER SURGICAL      arm surgery s/p forearm fx as a small child   ??? HX TONSILLECTOMY  07/27/2015    tonsillectomy 07/27/15 Dr.Charles Gasser         Family History   Problem Relation Age of Onset   ??? Attention Deficit Disorder Mother    ??? No Known Problems Father    ??? No Known Problems Sister    ??? Attention Deficit Disorder Brother         Brother- ADHD   ??? No Known Problems Maternal Grandmother    ??? Other Paternal Grandmother         PGM healthy, hip replacement   ??? Hypertension Paternal Grandfather         Social History     Socioeconomic History   ??? Marital status: MARRIED     Spouse name: Not on file   ??? Number of children: Not on file   ??? Years of education: Not on file   ??? Highest education level: Not on file   Occupational History   ??? Not on file   Social Needs   ??? Financial resource strain: Not on file   ??? Food insecurity:     Worry: Not on file     Inability: Not on file   ??? Transportation needs:     Medical: Not on file     Non-medical: Not on file   Tobacco Use   ??? Smoking status: Never Smoker    ??? Smokeless tobacco: Never Used   Substance and Sexual Activity   ??? Alcohol use: Not on file   ??? Drug use: Not on file   ??? Sexual activity: Not on file   Lifestyle   ??? Physical activity:     Days per week: Not on file     Minutes per session: Not on file   ??? Stress: Not on file   Relationships   ??? Social connections:     Talks on phone: Not on file     Gets together: Not on file     Attends religious service: Not on file     Active member of club or organization: Not on file     Attends meetings of clubs or organizations: Not on file     Relationship status: Not on file   ??? Intimate partner violence:     Fear of current or ex partner: Not  on file     Emotionally abused: Not on file     Physically abused: Not on file     Forced sexual activity: Not on file   Other Topics Concern   ??? Not on file   Social History Narrative    Marital Status Married    Children:no children, she has had fertility treatment in the past     Occupation:self employed,message therapy    pregnant                ALLERGIES: Patient has no known allergies.    Review of Systems    Vitals:    10/05/17 1618   BP: 106/68   Pulse: 80   Resp: 18   Temp: 98.2 ??F (36.8 ??C)   SpO2: 96%       Physical Exam  General: well developed, NAD  Head: atraumatic, normocephalic  Eyes: PERRLA, EOMI  Ears: bilateral tympanic membranes pearly gray, non-bulging, normal light reflex  Nose: clear serous discharge  Oropharynx: buccal mucosa moist, oropharynx clear, without tonsillar enlargement or injection and exudates  Neck: supply, without adenopathy  Lungs:   Expiratory wheezes noted with frequent coughing  Heart: regular rate and rhythm  Skin: warm and dry  Psych: alert and oriented    MDM  Patients vitals are stable, appears well hydrated and nontoxic. Without findings to support otitis media, strep pharyngitis, pneumonia.   Nebulizer treatment provided to patient who noted improvement of symptoms.  Will treat with cough suppressant and oral steroid.  Continue oral  antibiotic as prescribed.  Likely viral etiology. Suggested conservative treatments and OTC medication for supportive care.         Procedures

## 2017-10-05 NOTE — Other (Signed)
Patient has cough and shortness of breath since Saturday. Patient is currently taking amoxicillin for strept throat.

## 2017-10-06 MED ORDER — ALBUTEROL SULFATE HFA 90 MCG/ACTUATION AEROSOL INHALER
90 mcg/actuation | RESPIRATORY_TRACT | 0 refills | Status: AC
Start: 2017-10-06 — End: ?

## 2017-10-06 MED ORDER — VENTOLIN HFA 90 MCG/ACTUATION AEROSOL INHALER
90 mcg/actuation | RESPIRATORY_TRACT | 1 refills | Status: AC | PRN
Start: 2017-10-06 — End: ?

## 2018-01-20 ENCOUNTER — Inpatient Hospital Stay: Admit: 2018-01-20 | Discharge: 2018-01-20 | Payer: PRIVATE HEALTH INSURANCE | Primary: Family

## 2018-01-20 DIAGNOSIS — E669 Obesity, unspecified: Secondary | ICD-10-CM

## 2018-01-20 NOTE — Progress Notes (Signed)
01/20/18      Dorothy Santos  1986-03-02    Referring Provider: Tressia Danas NP - [facility]    History of Present Illness  32 y.o. female arrives for individual initial nutrition assessment for diagnosis of obesity on 01/20/18 with the [facility] Outpatient Nutrition Service Department. This patient is very familiar to Clinical research associate d/t a year spent with the [facility] prior to the program's closing in August 2018. Very early in Dorothy Santos's time with the Weight Management Program, she learned she was pregnant. She used her time with the Weight Management Program for support for healthful weight gain in pregnancy. This baby, "Dorothy Santos", is now almost one year old. Reports continuing with many of the tools she used in Weight Management. Followed a moderate CHO plan while in Weight Management, with approx 30-45g CHO/meal and 15g CHO/snack. Did very well with this. [name] continues to nurse [place] once per day. Estimates he drinks approx 3oz. Plans to continue to nurse for another month and then stop for a while before the baby is born.     [name] reports initially scheduling this visit for assistance with stomach issues she was having of unknown etiology. States her PCP suggested she try a low FODMAP diet and initially scheduled this visit to discuss the low FODMAP diet in detail. However, in her time since obtaining a referral to this office, Dorothy Santos has learned she is pregnant with her second child. She believes this pregnancy was causing some of her stomach issues. Would like to continue with a low FODMAP diet, but not as strictly. States her main concern now is obtaining enough nutrition to support a healthy pregnancy. Has been struggling with nausea. Nausea is worse if she does not eat something every 2 hours or so. Eating frequently is difficult because Dorothy Santos is home alone with Dorothy Santos most of the day.  Often gets busy and forgets to eat. Has multiple family members who offer support, but feels uncomfortable asking for help. [name]'s husband works 5:00 PM - 3:00 AM and is generally not around to help with supper.  Would like to go "back to basics" with writer, discussing kcal, CHO, and protein recommendations for a healthy pregnancy. Would like to follow a low FODMAP diet as much as possible. Would like to discuss options for quick meals and snacks throughout the day.      Food Allergies: NKFA  Food Intolerances: Suspected dairy and gluten. Intolerant of high fat foods after cholecystectomy.   Significant PMH: IBS, low back pain, GERD, s/p cholecystectomy 6 yrs ago  Supplements: Prenatal MVI  Nutrition Related Medications: Colace PRN  Nutrition Related Lab Values: None available  Exercise/Activity Level: Limited     Anthropometric Data:  Previous weight: Reports prepregnancy wt of 180-185 lbs.  Today's weight: 182.2 lbs  Height: 67 in  Today's BMI (in kg/m^2): 28.5        Estimated Energy Requirements:   First Trimester Pregnancy: 2200 calories per day ([employer] predictive equation with activity factor of 1.3) + 50-100 kcals/day for nursing 3oz/day.  Second Trimester Pregnancy: 2450 calories/day (first trimester + 250 kcals/day).  Third Trimester Pregnancy: 2650 calories/day (first trimester + 450 kcals/day).  Estimated Protein Requirements:  70g pro/day (1.1g/kg using pregravid wt).  Estimated Fluid Requirement:  2800-3300 mL fluid/day (35-40 mL/kg using pregravid wt).    Micronutrients of Concern and Recommendations in Pregnancy:  Calcium: 1,000 mg/day  Folate: 600 mcg/day  Iron: 27 mg/day  Other Micronutrients of Concern in Pregnancy:  Omega-3 fatty acids docosahexaenoic acid and icosapentaenoic acid, choline, vitamin B-12, zinc, vitamin D.        Nutrition Diagnosis: Inadequate oral intake related to nausea as evidenced by poor appetite and multiple food aversions in pregnancy.         ASSESSMENT & PLAN   #1: Obesity (E66.9) - Improved. BMI is 28.5.  --Reviewed general healthy diet in pregnancy with a balance of foods from each of the food groups. Discussed well-balanced diet in terms of meeting recommended intake of micronutrients as above. Also encouraged continuation of MVI.   --Reviewed above guidelines for kcals and pro, per pt request.   --Reviewed strategies that were successful in last pregnancy.   --Discussed the importance of small, frequent meals/snacks. Discussed quick options that pt can eat while she is taking care of her 35 y/o son.   --Reviewed the importance of pairing CHO foods with protein foods. Reviewed guidelines of 30-45g CHO/meal and 15g CHO/snack that worked well during last pregnancy.   --Discussed the importance of self-care and encouraged pt to ask for help, when needed.   --Reviewed the importance of adequate hydration.   --Reviewed specifics of a low FODMAP diet. Encouraged pt to look at foods on the high FODMAP foods list and any correlation between these foods and GI symptoms.      Handouts Provided: Low FODMAP/High FODMAP foods list    Personal Goals:   1.) Focus on consuming enough nutrition to prevent nausea and to sustain pregnancy and nursing. Will accomplish this by consuming small/frequent meals and snacks.   2.) Will keep easy-to-consume snacks readily available.   3.) Will schedule an afternoon snack to consume with [name].   4.) Focus on adequate hydration.     Follow-Up: As needed.      Number of Authorized Visits for MNT: 99  Number of Visits Used: 1     Visit duration is 60 minutes.     Thank you for including me in this patient's care.  - Takayla Baillie J Karthik Whittinghill MS, RD, CSOWM, LD

## 2018-01-20 NOTE — Progress Notes (Signed)
01/20/18      Dorothy Santos  19-May-1986    Referring Provider: Tressia Danas NP - [facility]    History of Present Illness  32 y.o. female arrives for individual initial nutrition assessment for diagnosis of obesity on 01/20/18 with the [facility] Outpatient Nutrition Service Department. This patient is very familiar to Clinical research associate d/t a year spent with the [facility] prior to the program's closing in August 2018. Very early in Dorothy Santos's time with the Weight Management Program, she learned she was pregnant. She used her time with the Weight Management Program for support for healthful weight gain in pregnancy. This baby, "Dorothy Santos", is now almost one year old. Reports continuing with many of the tools she used in Weight Management. Followed a moderate CHO plan while in Weight Management, with approx 30-45g CHO/meal and 15g CHO/snack. Did very well with this. Dorothy Santos continues to nurse Dorothy Santos once per day. Estimates he drinks approx 3oz. Plans to continue to nurse for another month and then stop for a while before the baby is born.     Dorothy Santos reports initially scheduling this visit for assistance with stomach issues she was having of unknown etiology. States her PCP suggested she try a low FODMAP diet and initially scheduled this visit to discuss the low FODMAP diet in detail. However, in her time since obtaining a referral to this office, Dorothy Santos has learned she is pregnant with her second child. She believes this pregnancy was causing some of her stomach issues. Would like to continue with a low FODMAP diet, but not as strictly. States her main concern now is obtaining enough nutrition to support a healthy pregnancy. Has been struggling with nausea. Nausea is worse if she does not eat something every 2 hours or so. Eating frequently is difficult because Dorothy Santos is home alone with Dorothy Santos most of the day. Often gets busy and forgets to eat. Has multiple  family members who offer support, but feels uncomfortable asking for help. Dorothy Santos's husband works 5:00 PM - 3:00 AM and is generally not around to help with supper.  Would like to go "back to basics" with writer, discussing kcal, CHO, and protein recommendations for a healthy pregnancy. Would like to follow a low FODMAP diet as much as possible. Would like to discuss options for quick meals and snacks throughout the day.      Food Allergies: NKFA  Food Intolerances: Suspected dairy and gluten. Intolerant of high fat foods after cholecystectomy.   Significant PMH: IBS, low back pain, GERD, s/p cholecystectomy 6 yrs ago  Supplements: Prenatal MVI  Nutrition Related Medications: Colace PRN  Nutrition Related Lab Values: None available  Exercise/Activity Level: Limited     Anthropometric Data:  Previous weight: Reports prepregnancy wt of 180-185 lbs.  Today's weight: 182.2 lbs  Height: 67 in  Today's BMI (in kg/m^2): 28.5        Estimated Energy Requirements:   First Trimester Pregnancy: 2200 calories per day ([employer] predictive equation with activity factor of 1.3) + 50-100 kcals/day for nursing 3oz/day.  Second Trimester Pregnancy: 2450 calories/day (first trimester + 250 kcals/day).  Third Trimester Pregnancy: 2650 calories/day (first trimester + 450 kcals/day).  Estimated Protein Requirements:  70g pro/day (1.1g/kg using pregravid wt).  Estimated Fluid Requirement:  2800-3300 mL fluid/day (35-40 mL/kg using pregravid wt).    Micronutrients of Concern and Recommendations in Pregnancy:  Calcium: 1,000 mg/day  Folate: 600 mcg/day  Iron: 27 mg/day  Other Micronutrients of Concern in Pregnancy:  Omega-3 fatty acids docosahexaenoic acid and icosapentaenoic acid, choline, vitamin B-12, zinc, vitamin D.        Nutrition Diagnosis: Inadequate oral intake related to nausea as evidenced by poor appetite and multiple food aversions in pregnancy.        ASSESSMENT & PLAN   #1: Obesity (E66.9) - Improved. BMI is  28.5.  --Reviewed general healthy diet in pregnancy with a balance of foods from each of the food groups. Discussed well-balanced diet in terms of meeting recommended intake of micronutrients as above. Also encouraged continuation of MVI.   --Reviewed above guidelines for kcals and pro, per pt request.   --Reviewed strategies that were successful in last pregnancy.   --Discussed the importance of small, frequent meals/snacks. Discussed quick options that pt can eat while she is taking care of her 9 y/o son.   --Reviewed the importance of pairing CHO foods with protein foods. Reviewed guidelines of 30-45g CHO/meal and 15g CHO/snack that worked well during last pregnancy.   --Discussed the importance of self-care and encouraged pt to ask for help, when needed.   --Reviewed the importance of adequate hydration.   --Reviewed specifics of a low FODMAP diet. Encouraged pt to look at foods on the high FODMAP foods list and any correlation between these foods and GI symptoms.      Handouts Provided: Low FODMAP/High FODMAP foods list    Personal Goals:   1.) Focus on consuming enough nutrition to prevent nausea and to sustain pregnancy and nursing. Will accomplish this by consuming small/frequent meals and snacks.   2.) Will keep easy-to-consume snacks readily available.   3.) Will schedule an afternoon snack to consume with Dorothy Santos.   4.) Focus on adequate hydration.     Follow-Up: As needed.      Number of Authorized Visits for MNT: 99  Number of Visits Used: 1     Visit duration is 60 minutes.     Thank you for including me in this patient's care.  - Sumie Remsen J Vianny Schraeder MS, RD, CSOWM, LD

## 2018-02-25 ENCOUNTER — Emergency Department: Payer: BLUE CROSS/BLUE SHIELD

## 2018-02-25 ENCOUNTER — Emergency Department
Admission: EM | Admit: 2018-02-25 | Discharge: 2018-02-25 | Disposition: A | Discharge status: Home / Self Care | Payer: BLUE CROSS/BLUE SHIELD | Attending: Emergency Medicine | Admitting: Emergency Medicine

## 2018-02-25 ENCOUNTER — Other Ambulatory Visit: Payer: Self-pay

## 2018-02-25 DIAGNOSIS — Z79899 Other long term (current) drug therapy: Secondary | ICD-10-CM | POA: Diagnosis not present

## 2018-02-25 DIAGNOSIS — R55 Syncope and collapse: Secondary | ICD-10-CM | POA: Insufficient documentation

## 2018-02-25 DIAGNOSIS — I10 Essential (primary) hypertension: Secondary | ICD-10-CM | POA: Insufficient documentation

## 2018-02-25 DIAGNOSIS — E119 Type 2 diabetes mellitus without complications: Secondary | ICD-10-CM | POA: Diagnosis not present

## 2018-02-25 DIAGNOSIS — F17200 Nicotine dependence, unspecified, uncomplicated: Secondary | ICD-10-CM | POA: Insufficient documentation

## 2018-02-25 LAB — CBC
HCT: 31.4 % — ABNORMAL LOW (ref 36.0–46.0)
HEMOGLOBIN: 9.9 g/dL — AB (ref 12.0–15.0)
MCH: 26.9 pg (ref 26.0–34.0)
MCHC: 31.5 g/dL (ref 30.0–36.0)
MCV: 85.3 fL (ref 80.0–100.0)
Platelets: 425 10*3/uL — ABNORMAL HIGH (ref 150–400)
RBC: 3.68 MIL/uL — ABNORMAL LOW (ref 3.87–5.11)
RDW: 15 % (ref 11.5–15.5)
WBC: 8.7 10*3/uL (ref 4.0–10.5)
nRBC: 0 % (ref 0.0–0.2)

## 2018-02-25 LAB — BASIC METABOLIC PANEL
Anion gap: 10 (ref 5–15)
BUN: 12 mg/dL (ref 6–20)
CO2: 26 mmol/L (ref 22–32)
CREATININE: 0.99 mg/dL (ref 0.44–1.00)
Calcium: 9.2 mg/dL (ref 8.9–10.3)
Chloride: 105 mmol/L (ref 98–111)
GFR calc non Af Amer: >60 mL/min (ref >60)
Glucose, Bld: 100 mg/dL — ABNORMAL HIGH (ref 70–99)
Potassium: 4.3 mmol/L (ref 3.5–5.1)
SODIUM: 141 mmol/L (ref 135–145)

## 2018-02-25 LAB — HEPATIC FUNCTION PANEL
ALBUMIN: 3.6 g/dL (ref 3.5–5.0)
ALK PHOS: 71 U/L (ref 38–126)
ALT: 15 U/L (ref 0–44)
AST: 18 U/L (ref 15–41)
Bilirubin, Direct: <0.1 mg/dL (ref 0.0–0.2)
TOTAL PROTEIN: 7.5 g/dL (ref 6.5–8.1)
Total Bilirubin: 0.4 mg/dL (ref 0.3–1.2)

## 2018-02-25 LAB — TYPE AND SCREEN
ABO/RH(D): O POS
Antibody Screen: NEGATIVE

## 2018-02-25 LAB — BRAIN NATRIURETIC PEPTIDE: B Natriuretic Peptide: 396 pg/mL — ABNORMAL HIGH (ref 0.0–100.0)

## 2018-02-25 LAB — TROPONIN I

## 2018-02-25 NOTE — ED Triage Notes (Addendum)
Pt comes via POV from home with c/o feeling like she is going to pass out. Pt states she recently had C-section at 34 weeks February 15, 2018. Pt states hx of hypertension and diabetes prior to pregnancy. Pt states she took her BP medication and other prescribed meds. BP-162/106. Pt denies any swelling. Pt also states that her placenta had detached and that is the reason for the c-section. Pt checked her BS today at 11am- 88.

## 2018-02-25 NOTE — Discharge Instructions (Addendum)
Please follow-up with your doctor tomorrow as scheduled.  Return to the emergency department for any further episodes of near syncope/passing out, any chest pain, trouble breathing, or any other symptom personally concerning to yourself.

## 2018-02-25 NOTE — ED Notes (Signed)
Patient transported to X-ray 

## 2018-02-25 NOTE — ED Provider Notes (Signed)
[facility] Emergency Department Provider Note  Time seen: 7:44 PM  I have reviewed the triage vital signs and the nursing notes.   HISTORY  Chief Complaint Near Syncope    HPI Suzanne Fernandez is a 32 y.o. female with a past medical history of diabetes, hypertension proximate 10 days status post C-section presents to the emergency department after near syncopal episode today.  According to the patient she felt somewhat dizzy, and felt like she was going to pass out, states that the near syncopal episode was brief but continued to feel somewhat dizzy.  Patient came to the emergency department for evaluation.  Patient is hypertensive currently 160s over 90s.  Patient states a history of hypertension and states her blood pressure throughout her pregnancy was approximately 160/80.  Patient denies any chest pain or trouble breathing.  Denies any weakness, numbness.  Denies ever being diagnosed with preeclampsia during her pregnancy.  Patient takes blood pressure medications, denies any recent changes.   Past Medical History:  Diagnosis Date  . Diabetes mellitus without complication (HCC)   . Hypertension   . PCOS (polycystic ovarian syndrome)     There are no active problems to display for this patient.   History reviewed. No pertinent surgical history.  Prior to Admission medications   Medication Sig Start Date End Date Taking? Authorizing Provider  acetaminophen (TYLENOL) 325 MG tablet Take 650 mg by mouth every 6 (six) hours as needed.   Yes [provider]  enalapril (VASOTEC) 10 MG tablet Take 10 mg by mouth daily.   Yes [provider]  ibuprofen (ADVIL,MOTRIN) 600 MG tablet Take 600 mg by mouth every 6 (six) hours as needed.   Yes [provider]  labetalol (NORMODYNE) 300 MG tablet Take 600 mg by mouth 2 (two) times daily.   Yes [provider]  metFORMIN (GLUCOPHAGE-XR) 500 MG 24 hr tablet Take 500 mg by mouth 2 (two)  times daily.    Yes [provider]  amLODipine (NORVASC) 2.5 MG tablet Take 1 tablet (2.5 mg total) by mouth daily. Patient not taking: Reported on 02/25/2018 03/14/17 03/14/18  Myrna Blazer, MD  gabapentin (NEURONTIN) 100 MG capsule Take 1 capsule (100 mg total) by mouth 3 (three) times daily. 02/04/16 02/03/17  Beers, Charmayne Sheer, PA-C  medroxyPROGESTERone (PROVERA) 5 MG tablet Take 1 tablet (5 mg total) by mouth daily. 11/28/14 11/28/15  Rebecka Apley, MD  metFORMIN (GLUCOPHAGE) 500 MG tablet Take 1 tablet (500 mg total) by mouth daily with breakfast. Patient not taking: Reported on 02/25/2018 03/14/17 03/14/18  Myrna Blazer, MD  ondansetron (ZOFRAN) 4 MG tablet Take 1 tablet (4 mg total) by mouth every 8 (eight) hours as needed for nausea or vomiting. 03/21/17   Phineas Semen, MD    No Known Allergies  No family history on file.  Social History Social History   Tobacco Use  . Smoking status: Current Every Day Smoker  . Smokeless tobacco: Never Used  Substance Use Topics  . Alcohol use: No  . Drug use: No    Review of Systems Constitutional: Negative for fever. Eyes: Negative for visual complaints Cardiovascular: Negative for chest pain. Respiratory: Negative for shortness of breath. Gastrointestinal: Negative for abdominal pain, vomiting  Genitourinary: Negative for urinary compaints Musculoskeletal: Negative for musculoskeletal complaints Skin: Negative for skin complaints  Neurological: Negative for headache All other ROS negative  ____________________________________________   PHYSICAL EXAM:  VITAL SIGNS: ED Triage Vitals  Enc Vitals  Group     BP 02/25/18 1428 (!) 162/106     Pulse Rate 02/25/18 1428 77     Resp 02/25/18 1428 19     Temp 02/25/18 1428 98.6 F (37 C)     Temp src --      SpO2 02/25/18 1428 98 %     Weight 02/25/18 1431 180 lb (81.6 kg)     Height 02/25/18 1431 5\' 4"  (1.626 m)     Head Circumference --       Peak Flow --      Pain Score 02/25/18 1428 0     Pain Loc --      Pain Edu? --      Excl. in GC? --    Constitutional: Alert and oriented. Well appearing and in no distress. Eyes: Normal exam ENT   Head: Normocephalic and atraumatic.   Mouth/Throat: Mucous membranes are moist. Cardiovascular: Normal rate, regular rhythm. No murmur Respiratory: Normal respiratory effort without tachypnea nor retractions. Breath sounds are clear  Gastrointestinal: Soft and nontender. No distention.   Musculoskeletal: Nontender with normal range of motion in all extremities. No lower extremity tenderness or edema. Neurologic:  Normal speech and language. No gross focal neurologic deficits Skin:  Skin is warm, dry and intact.  Psychiatric: Mood and affect are normal. Speech and behavior are normal.   ____________________________________________    EKG  EKG reviewed and interpreted by myself shows a normal sinus rhythm at 77 bpm with a narrow QRS, normal axis, normal intervals, no concerning ST changes.  ____________________________________________    RADIOLOGY  Chest x-ray negative  ____________________________________________   INITIAL IMPRESSION / ASSESSMENT AND PLAN / ED COURSE  Pertinent labs & imaging results that were available during my care of the patient were reviewed by me and considered in my medical decision making (see chart for details).  Patient presents to the emergency department for near syncope/weakness today.  Differential is quite broad but would include electrolyte or metabolic abnormality, anemia, CHF/cardiomyopathy, ACS.  Patient's labs are reassuring, chest x-ray is clear, labs are largely within normal limits including a negative troponin.  Patient is hypertensive proximal he 160s over 90s.  Patient states this is similar to her blood pressures throughout her pregnancy.  Reassuringly LFTs are normal, platelets are normal.  I did wish to check a urinalysis during  the patient's evaluation, she was unable to provide urine sample and after many hours in the emergency department wishes to be discharged home.  States she has a follow-up appointment with her OB/GYN tomorrow and she will insist that they run a urinalysis sample which I believe is appropriate and reasonable.  Patient remains asymptomatic in the emergency department.  We will discharge with OB follow-up tomorrow.  ____________________________________________   FINAL CLINICAL IMPRESSION(S) / ED DIAGNOSES  Near-syncope    Minna Antis, MD 02/25/18 2005

## 2020-05-15 ENCOUNTER — Inpatient Hospital Stay
Admit: 2020-05-15 | Discharge: 2020-05-15 | Disposition: A | Discharge status: Home / Self Care | Payer: PRIVATE HEALTH INSURANCE | Attending: Physician Assistant

## 2020-05-15 DIAGNOSIS — R6889 Other general symptoms and signs: Secondary | ICD-10-CM

## 2020-05-15 LAB — POC COVID-19, INFLUENZA A+B, RSV,BY PCR
COVID-19 POC: NEGATIVE
Kit Lot No.: 1000323476
POC Influenza A&B: POSITIVE — AB
POC RSV: NEGATIVE

## 2020-05-15 LAB — POC GROUP A STREP
GROUP A STREP SCREEN:: NEGATIVE
Group A strep (POC): NEGATIVE
KIT LOT NO.:: 8211185 NA
Lot no.: 8211185

## 2020-05-15 MED ORDER — OSELTAMIVIR PHOSPHATE 75 MG CAP
75 mg | ORAL_CAPSULE | Freq: Every day | ORAL | 0 refills | Status: AC
Start: 2020-05-15 — End: 2020-05-20

## 2020-05-15 MED ORDER — ACETAMINOPHEN 325 MG TABLET
325 mg | ORAL | Status: AC
Start: 2020-05-15 — End: 2020-05-15
  Administered 2020-05-15: 22:00:00 via ORAL

## 2020-05-15 MED FILL — TYLENOL 325 MG TABLET: 325 mg | ORAL | Qty: 3

## 2020-05-15 NOTE — ED Provider Notes (Signed)
Patient is a 34 year old female presenting to the urgent care headache, cough, congestion, sore throat, ear pain, fever, chills.  States her symptoms began approximately 9 days ago with sore throat, congestion, cough. This past weekend symptoms seemed to have improved and then yesterday symptoms worsened.  Patient states she is fully vaccinated for Covid-19.  She did have a negative PCR test for Covid-19 last week. Some SOB on exertion. Denies CP. Fever up to 100.2 this morning. Taking tylenol or dayquil. Last dose was approximately 11am. Denies vomiting/diarrhea. Whole family has similar symptoms. Got together with family over the weekend, and they all have symptoms now that have started since Christmas.  Patient denies history of asthma/COPD.           Past Medical History:   Diagnosis Date   ??? Depression     Depression/anxiety: improved   ??? Fracture of right radius and ulna     fx r ulna, radius age 61   ??? Irregular menses     irreg menses - 36 day average cycle, on clomid summer 2017   ??? Neck pain    ??? Overweight    ??? 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 HX CHOLECYSTECTOMY  2013   ??? HX OTHER SURGICAL      arm surgery s/p forearm fx as a small child   ??? HX TONSILLECTOMY  07/27/2015    tonsillectomy 07/27/15 Dr.Charles Gasser         Family History   Problem Relation Age of Onset   ??? Attention Deficit Disorder Mother    ??? No Known Problems Father    ??? No Known Problems Sister    ??? Attention Deficit Disorder Brother         Brother- ADHD   ??? No Known Problems Maternal Grandmother    ??? Other Paternal Grandmother         PGM healthy, hip replacement   ??? Hypertension Paternal Grandfather         Social History     Socioeconomic History   ??? Marital status: MARRIED     Spouse name: Not on file   ??? Number of children: Not on file   ??? Years of education: Not on file   ??? Highest education level: Not on file   Occupational  History   ??? Not on file   Tobacco Use   ??? Smoking status: Never Smoker   ??? Smokeless tobacco: Never Used   Substance and Sexual Activity   ??? Alcohol use: Not on file   ??? Drug use: Not on file   ??? Sexual activity: Not on file   Other Topics Concern   ??? Not on file   Social History Narrative    Marital Status Married    Children:no children, she has had fertility treatment in the past     Occupation:self employed,message therapy    pregnant     Social Determinants of Health     Financial Resource Strain:    ??? Difficulty of Paying Living Expenses: Not on file   Food Insecurity:    ??? Worried About Programme researcher, broadcasting/film/video in the Last Year: Not on file   ??? Ran Out of Food in the Last Year: Not on file   Transportation Needs:    ??? Lack of Transportation (Medical): Not on file   ???  Lack of Transportation (Non-Medical): Not on file   Physical Activity:    ??? Days of Exercise per Week: Not on file   ??? Minutes of Exercise per Session: Not on file   Stress:    ??? Feeling of Stress : Not on file   Social Connections:    ??? Frequency of Communication with Friends and Family: Not on file   ??? Frequency of Social Gatherings with Friends and Family: Not on file   ??? Attends Religious Services: Not on file   ??? Active Member of Clubs or Organizations: Not on file   ??? Attends Banker Meetings: Not on file   ??? Marital Status: Not on file   Intimate Partner Violence:    ??? Fear of Current or Ex-Partner: Not on file   ??? Emotionally Abused: Not on file   ??? Physically Abused: Not on file   ??? Sexually Abused: Not on file   Housing Stability:    ??? Unable to Pay for Housing in the Last Year: Not on file   ??? Number of Places Lived in the Last Year: Not on file   ??? Unstable Housing in the Last Year: Not on file                ALLERGIES: Patient has no known allergies.    Review of Systems   Constitutional: Positive for fatigue and fever.   HENT: Positive for congestion, rhinorrhea and sore throat. Negative for sinus pressure, sinus pain and  trouble swallowing.    Respiratory: Positive for cough and shortness of breath. Negative for wheezing.    Cardiovascular: Negative for chest pain.   Gastrointestinal: Negative for abdominal pain, diarrhea, nausea and vomiting.   Skin: Negative for rash.   All other systems reviewed and are negative.      Vitals:    05/15/20 1624   BP: 101/68   Pulse: (!) 102   Resp: 18   Temp: 99.9 ??F (37.7 ??C)   SpO2: 100%       Physical Exam      GENERAL: well-developed, well-nourished, in no distress  HEAD: atraumatic, normocephalic, non-tender frontal and maxillary sinuses on percussion  EYES: PERRLA, no eye discharge or injection  OROPHARYNX: buccal mucosa moist, oropharynx clear; no posterior pharyngeal cobblestoning; no tonsillar swelling, injection or exudates  NECK: supple, no adenopathy, full ROM, non-tender, no stridor  LUNGS: clear to auscultation bilaterally, no wheezes, rales or rhonchi; no percussion tenderness or dullness; no accessory retraction or nasal flaring  HEART: regular rate and rhythm                MDM  Rapid strep negative.  Culture sent.  Negative abscess.  Negative trismus or swelling under the tongue.  Patient's vitals stable, patient nontoxic-appearing well-hydrated.  PCR testing for Covid-19, influenza, RSV was obtained.  Awaiting results.  Patient without signs of bacterial sinusitis, otitis media, pharyngitis, or pneumonia on examination.  No indication for antibiotics at this time.  Patient will start over-the-counter medications for supportive care.   All questions and concerns addressed and answered.  Patient agrees with the plan of care.            Procedures

## 2020-05-15 NOTE — ED Notes (Signed)
Patient states that she has had a headache, cough, congestion, sore throat, ear pain and a fever since last Sunday. Patient denies known exposures to COVID-19. Patient states that she had a PCR COVID-19 test last week which was negative. Patient is vaccinated for COVID-19.

## 2020-05-15 NOTE — Progress Notes (Signed)
Discussed results with patient. All questions answered.

## 2020-05-17 LAB — CULTURE, STREP THROAT
Culture result:: NEGATIVE
Culture: NEGATIVE

## 2020-07-19 ENCOUNTER — Emergency Department: Payer: BC Managed Care – PPO

## 2020-07-19 ENCOUNTER — Emergency Department
Admission: EM | Admit: 2020-07-19 | Discharge: 2020-07-19 | Disposition: A | Discharge status: Home / Self Care | Payer: BC Managed Care – PPO | Attending: Emergency Medicine | Admitting: Emergency Medicine

## 2020-07-19 ENCOUNTER — Other Ambulatory Visit: Payer: Self-pay

## 2020-07-19 DIAGNOSIS — M542 Cervicalgia: Secondary | ICD-10-CM | POA: Diagnosis present

## 2020-07-19 DIAGNOSIS — Z79899 Other long term (current) drug therapy: Secondary | ICD-10-CM | POA: Insufficient documentation

## 2020-07-19 DIAGNOSIS — F17200 Nicotine dependence, unspecified, uncomplicated: Secondary | ICD-10-CM | POA: Insufficient documentation

## 2020-07-19 DIAGNOSIS — R0781 Pleurodynia: Secondary | ICD-10-CM | POA: Insufficient documentation

## 2020-07-19 DIAGNOSIS — Y92410 Unspecified street and highway as the place of occurrence of the external cause: Secondary | ICD-10-CM | POA: Diagnosis not present

## 2020-07-19 DIAGNOSIS — R519 Headache, unspecified: Secondary | ICD-10-CM | POA: Insufficient documentation

## 2020-07-19 DIAGNOSIS — I10 Essential (primary) hypertension: Secondary | ICD-10-CM | POA: Diagnosis not present

## 2020-07-19 DIAGNOSIS — Z7984 Long term (current) use of oral hypoglycemic drugs: Secondary | ICD-10-CM | POA: Insufficient documentation

## 2020-07-19 DIAGNOSIS — E119 Type 2 diabetes mellitus without complications: Secondary | ICD-10-CM | POA: Diagnosis not present

## 2020-07-19 DIAGNOSIS — M25511 Pain in right shoulder: Secondary | ICD-10-CM | POA: Insufficient documentation

## 2020-07-19 MED ORDER — METHOCARBAMOL 500 MG PO TABS
750.0000 mg | ORAL_TABLET | Freq: Once | ORAL | Status: AC
  Administered 2020-07-19: 750 mg via ORAL
  Filled 2020-07-19: qty 2

## 2020-07-19 MED ORDER — ACETAMINOPHEN 325 MG PO TABS
650.0000 mg | ORAL_TABLET | Freq: Once | ORAL | Status: AC
  Administered 2020-07-19: 650 mg via ORAL
  Filled 2020-07-19: qty 2

## 2020-07-19 MED ORDER — MELOXICAM 15 MG PO TABS: 15.0000 mg | ORAL_TABLET | Freq: Every day | ORAL | 0 refills | Status: AC

## 2020-07-19 MED ORDER — IBUPROFEN 600 MG PO TABS
600.0000 mg | ORAL_TABLET | Freq: Once | ORAL | Status: AC
  Administered 2020-07-19: 600 mg via ORAL
  Filled 2020-07-19: qty 1

## 2020-07-19 MED ORDER — METHOCARBAMOL 750 MG PO TABS: 750.0000 mg | ORAL_TABLET | Freq: Four times a day (QID) | ORAL | 0 refills | Status: AC | PRN

## 2020-07-19 NOTE — Discharge Instructions (Addendum)
Take anti-inflammatory and muscle relaxant as prescribed.  Please use Tylenol as needed, up to 1000 mg 4 times a day.  Follow-up with primary care if not improved in 1 to 2 weeks.
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
## 2020-07-19 NOTE — ED Notes (Signed)
Patient declined discharge vital signs. 
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
## 2020-12-23 ENCOUNTER — Inpatient Hospital Stay
Admit: 2020-12-23 | Discharge: 2020-12-23 | Disposition: A | Discharge status: Home / Self Care | Payer: PRIVATE HEALTH INSURANCE | Attending: Family

## 2020-12-23 ENCOUNTER — Inpatient Hospital Stay: Admit: 2020-12-23 | Payer: PRIVATE HEALTH INSURANCE | Attending: Family | Primary: Family

## 2020-12-23 DIAGNOSIS — S90122A Contusion of left lesser toe(s) without damage to nail, initial encounter: Secondary | ICD-10-CM

## 2020-12-23 NOTE — ED Notes (Signed)
Patient is here today with left 5th digit toe pain after stubbing it last night. Notices pressure in the area when walking.

## 2020-12-23 NOTE — ED Provider Notes (Signed)
35 year old female presents to [facility] for evaluation of of left pinky toe pain after stubbing it on a door frame last night.  She has had persistent pain since and reports that it is painful to ambulate.  She denies any other injury sustained during this incident.  Denies previous history of left pinky toe fracture.      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        Past Surgical History:   Procedure Laterality Date    HX CHOLECYSTECTOMY  2013    HX OTHER SURGICAL      arm surgery s/p forearm fx as a small child    HX TONSILLECTOMY  07/27/2015    tonsillectomy 07/27/15 Dr.Charles Gasser         Family History   Problem Relation Age of Onset    Attention Deficit Disorder Mother     No Known Problems Father     No Known Problems Sister     Attention Deficit Disorder Brother         Brother- ADHD    No Known Problems Maternal Grandmother     Other Paternal Grandmother         PGM healthy, hip replacement    Hypertension Paternal Grandfather         Social History     Socioeconomic History    Marital status: MARRIED     Spouse name: Not on file    Number of children: Not on file    Years of education: Not on file    Highest education level: Not on file   Occupational History    Not on file   Tobacco Use    Smoking status: Never    Smokeless tobacco: Never   Substance and Sexual Activity    Alcohol use: Not on file    Drug use: Not on file    Sexual activity: Not on file   Other Topics Concern    Not on file   Social History Narrative    Marital Status Married    Children:no children, she has had fertility treatment in the past     Occupation:self employed,message therapy    pregnant     Social Determinants of  Health     Financial Resource Strain: Not on file   Food Insecurity: Not on file   Transportation Needs: Not on file   Physical Activity: Not on file   Stress: Not on file   Social Connections: Not on file   Intimate Partner Violence: Not on file   Housing Stability: Not on file      ALLERGIES: Patient has no known allergies.    Review of Systems   Constitutional: Negative.    Musculoskeletal:  Positive for arthralgias. Negative for joint swelling.   Skin: Negative.      Vitals:    12/23/20 1026   BP: 110/71   Pulse: (!) 53   Resp: 18   Temp: 99.1 ??F (37.3 ??C)   SpO2: 100%     Physical Exam  Vitals  and nursing note reviewed.   Constitutional:       General: She is awake. She is not in acute distress.     Appearance: Normal appearance. She is well-developed and normal weight. She is not ill-appearing or toxic-appearing.   HENT:      Head: Normocephalic and atraumatic.   Cardiovascular:      Pulses: Normal pulses.   Pulmonary:      Effort: Pulmonary effort is normal.   Musculoskeletal:      Left foot: Tenderness and bony tenderness present. No swelling.      Comments: Left pinky toe tender to palpation without ecchymosis, swelling, open areas of skin. No tenderness to foot   Neurological:      Mental Status: She is alert.   Psychiatric:         Behavior: Behavior is cooperative.     MDM    Patient is alert, oriented and in no acute distress.  Vital signs are stable.  Presenting for evaluation of traumatic left pinky toe pain without swelling, ecchymosis or open areas of skin.  No pain in to other areas of the foot.  Positive CSM and strong pulses throughout.  Neurovascularly intact without neurovascular compromise.  X-ray to my interpretation did not reveal any acute osseous process including fracture dislocation, pending formal radiology interpretation.  Patient had toes buddy-taped and postop shoe supplied.  Discussed conservative care measures.  Discussed reasons to present back for re-evaluation.  All questions  asked and answered and she is discharged in stable condition at this time.    Procedures

## 2021-03-01 ENCOUNTER — Inpatient Hospital Stay
Admit: 2021-03-01 | Discharge: 2021-03-01 | Disposition: A | Discharge status: Home / Self Care | Payer: PRIVATE HEALTH INSURANCE

## 2021-03-01 DIAGNOSIS — J029 Acute pharyngitis, unspecified: Secondary | ICD-10-CM

## 2021-03-01 LAB — POC GROUP A STREP
LOT NUMBER POC: 8221084
RAPID STREP A POC STREP: NEGATIVE

## 2021-03-01 MED ORDER — CHLORHEXIDINE GLUCONATE 0.12 % MT SOLN
0.12 % | Freq: Two times a day (BID) | OROMUCOSAL | 0 refills | Status: AC
Start: 2021-03-01 — End: 2021-03-11
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
## 2021-03-01 NOTE — ED Triage Notes (Signed)
Cough, congestion couple days. sore throat one week. Husband sick last week.     Covid yesterday neg.

## 2021-03-03 LAB — STREP A CULTURE, THROAT: Culture: NEGATIVE

## 2022-06-08 ENCOUNTER — Inpatient Hospital Stay
Admit: 2022-06-08 | Discharge: 2022-06-08 | Disposition: A | Discharge status: Home / Self Care | Payer: PRIVATE HEALTH INSURANCE

## 2022-06-08 DIAGNOSIS — J020 Streptococcal pharyngitis: Secondary | ICD-10-CM

## 2022-06-08 LAB — POC GROUP A STREP
LOT NUMBER POC: 8231280
RAPID STREP A POC STREP: NEGATIVE

## 2022-06-08 LAB — POC COVID-19
INFLUENZA A: NEGATIVE
INFLUENZA B: NEGATIVE
Kit lot no.: 3300234
SARS-COV-2, POC: NOT DETECTED

## 2022-06-08 MED ORDER — AMOXICILLIN 500 MG PO CAPS
500 MG | ORAL_CAPSULE | Freq: Two times a day (BID) | ORAL | 0 refills | Status: AC
Start: 2022-06-08 — End: 2022-06-18

## 2022-06-08 NOTE — ED Provider Notes (Signed)
Chief Complaint   Patient presents with    Cough    Nasal Congestion       37 year old patient, past medical history significant for anxiety, depression, and IBS, presents with multiple complaints. Patient endorses sore throat that began on Friday with subsequent non-productive cough and congestion. Patient reports resolution of sore throat. Patient's husband and son tested positive for strep and influenza A this morning. Patient's daughter is also being seen for similar symptoms. Denies history of asthma or COPD. Denies current smoking or history of. Denies shortness of breath or difficulty breathing. Denies ear pain, headaches. Patient is able to eat and drink. Denies systemic symptoms of fevers, chills, nausea. Patient has been taking tylenol and dayquil for symptom management.         The history is provided by the patient.       Medical History:  Current Problem List:   Patient Active Problem List   Diagnosis    Cranial somatic dysfunction    Cervical somatic dysfunction    Other impulse disorders    Irregular menses    Other obesity    Somatic dysfunction of thoracic region    Plantar fasciitis    Pruritus ani    BMI 34.0-34.9,adult    BMI 33.0-33.9,adult    Somatic dysfunction of rib cage region    Insulin resistance    Cervicalgia    Back pain, thoracic    Somatic dysfunction    Anxiety with depression    Pregnancy    BMI 35.0-35.9,adult    IBS (irritable bowel syndrome)    Pelvic somatic dysfunction    Lumbar region somatic dysfunction       Past Medical History:  Past Medical History:   Diagnosis Date    Depression     Depression/anxiety: improved    Fracture of right radius and ulna     fx r ulna, radius age 21    Irregular menses     irreg menses - 36 day average cycle, on clomid summer 2017    Neck pain     Overweight     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       Past Surgical History:   Procedure Laterality Date    CHOLECYSTECTOMY  2013    OTHER SURGICAL HISTORY       arm surgery s/p forearm fx as a small child    TONSILLECTOMY  07/27/2015    tonsillectomy 07/27/15 Dr.Charles Curlene Labrum       Social History     Socioeconomic History    Marital status: Married     Spouse name: Not on file    Number of children: Not on file    Years of education: Not on file    Highest education level: Not on file   Occupational History    Not on file   Tobacco Use    Smoking status: Never    Smokeless tobacco: Never   Vaping Use    Vaping Use: Never used   Substance and Sexual Activity    Alcohol use: Not Currently    Drug use: Never    Sexual activity: Not on file   Other Topics Concern    Not on file   Social History Narrative    Marital Status Married  Children:no children, she has had fertility treatment in the past   Occupation:self employed,message therapy  pregnant     Social Determinants of Health  Financial Resource Strain: Not on file   Food Insecurity: Not on file   Transportation Needs: Not on file   Physical Activity: Not on file   Stress: Not on file   Social Connections: Not on file   Intimate Partner Violence: Not on file   Housing Stability: Not on file       Family History:  Family History   Problem Relation Age of Onset    No Known Problems Maternal Grandmother     Attention Deficit Disorder Brother         Brother- ADHD    No Known Problems Sister     No Known Problems Father     Attention Deficit Disorder Mother     Other Paternal Grandmother         PGM healthy, hip replacement    Hypertension Paternal Grandfather        Medications:  Patient medication list reviewed  This patient does not have an active medication from one of the medication groupers.    Allergies:    Patient has no known allergies.    Review of Systems   Constitutional:  Negative for chills and fever.   HENT:  Positive for congestion and sore throat. Negative for ear pain, trouble swallowing and voice change.    Respiratory:  Positive for cough. Negative for shortness of breath.    Gastrointestinal:  Negative  for nausea.   Neurological:  Negative for headaches.         Vitals:    06/08/22 1416   BP: 132/87   Pulse: 73   Resp: 20   Temp: 98.6 F (37 C)   TempSrc: Tympanic   SpO2: 97%     Physical Exam  Constitutional:       General: She is not in acute distress.     Appearance: Normal appearance. She is not ill-appearing or toxic-appearing.   HENT:      Head: Normocephalic.      Right Ear: Hearing, tympanic membrane, ear canal and external ear normal. There is no impacted cerumen. No mastoid tenderness.      Left Ear: Hearing, tympanic membrane, ear canal and external ear normal. There is no impacted cerumen. No mastoid tenderness.      Nose: Congestion present.      Right Sinus: No maxillary sinus tenderness or frontal sinus tenderness.      Left Sinus: No maxillary sinus tenderness or frontal sinus tenderness.      Mouth/Throat:      Lips: Pink.      Mouth: Mucous membranes are moist.      Palate: No lesions.      Pharynx: Oropharynx is clear. Uvula midline. Posterior oropharyngeal erythema present. No pharyngeal swelling, oropharyngeal exudate or uvula swelling.   Cardiovascular:      Pulses: Normal pulses.      Heart sounds: Normal heart sounds.   Pulmonary:      Effort: Pulmonary effort is normal. No respiratory distress.      Breath sounds: Normal breath sounds.   Neurological:      Mental Status: She is alert.         Medical Decision Making:  37 year old afebrile, non-ill appearing patient presents with uncomplicated appearing pharyngitis, suspicious for strep. Rapid strep negative. Patient appears well-hydrated with moist oral mucosa and has been able to eat and drink despite pain. Uvula is midline without significant swelling or signs of uvulitis. Clinical evidence not concerning for pharyngeal abscess, epiglottitis,  or other airway compromise. Patient's 3 other household members tested positive for strep today with similar symptoms, so I do believe it is reasonable to treat patient even with a negative rapid  test. Through shared decision making with patient, plan to treat with antibiotics, Amoxicillin 500mg  BID x10 days with no prior history of amoxicillin/penicillin allergies. Advised to complete full course of antibiotics, even if beginning to feel better. Advised re-evaluation if symptoms not improving in the next 2-3 days. Counseled to go to ER if symptoms worsen or if patient unable to swallow liquids. Supportive care with tylenol, ibuprofen, chloraseptic spray, throat lozenges, and keeping hydrated also discussed. Exam is otherwise reassuring with no signs of pneumonia, bronchitis, ear or sinus infection.    Advised on typical signs and symptoms warranting follow-up or emergency care, patient declined formal print out of after visit summary instructions for recommended symptomatic care, follow up, and plan of care. All patient's questions and concerns addressed and answered. Patient agrees with the plan of care.             Procedures    Vital Signs for this visit:  Patient Vitals for the past 12 hrs:   Temp Pulse Resp BP SpO2   06/08/22 1416 98.6 F (37 C) 73 20 132/87 97 %       Lab findings during this visit (only abnormal values will be noted, if no value noted then the result was normal range):  Labs Reviewed   POC COVID-19   POC GROUP A STREP       Radiology studies during this visit and the past 24 hours:  No results found.    Medications given in the ED:  Medications - No data to display    Diagnosis:  Final diagnoses:   None       Discharge prescriptions and/or changes if applicable:       Medication List        ASK your doctor about these medications      fluticasone 50 MCG/ACT nasal spray  Commonly known as: FLONASE     levonorgestrel IUD 52 mg  Commonly known as: MIRENA              Follow-up if applicable:  No follow-up provider specified.    Please note that portions of this document were created using the M*Modal Fluency Direct dictation system.  Any inconsistencies or typographical errors may be  the result of mis-transcription that persist in spite of proof-reading and should be addressed with the document creator.        [place], APRN - NP  06/13/22 1151

## 2022-06-08 NOTE — ED Triage Notes (Signed)
Pt reports having a cough and nasal congestion since Friday, did have a sore throat but it had gone away, been having pressure in her head, been taking Dayquil and Tylenol, been exposed to FLU A.
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
